# Patient Record
Sex: Female | Born: 1955 | Race: White | Hispanic: No | Marital: Single | Smoking: Former smoker
Health system: Southern US, Community
[De-identification: ages and names within clinical notes are randomized; demographics above are authoritative.]

## PROBLEM LIST (undated history)

## (undated) DIAGNOSIS — F32A Depression, unspecified: Secondary | ICD-10-CM

## (undated) DIAGNOSIS — N6459 Other signs and symptoms in breast: Secondary | ICD-10-CM

## (undated) DIAGNOSIS — D1720 Benign lipomatous neoplasm of skin and subcutaneous tissue of unspecified limb: Secondary | ICD-10-CM

## (undated) DIAGNOSIS — Z86010 Personal history of colonic polyps: Secondary | ICD-10-CM

## (undated) DIAGNOSIS — J111 Influenza due to unidentified influenza virus with other respiratory manifestations: Secondary | ICD-10-CM

## (undated) DIAGNOSIS — M1990 Unspecified osteoarthritis, unspecified site: Secondary | ICD-10-CM

## (undated) DIAGNOSIS — F329 Major depressive disorder, single episode, unspecified: Secondary | ICD-10-CM

## (undated) DIAGNOSIS — E669 Obesity, unspecified: Secondary | ICD-10-CM

## (undated) HISTORY — DX: Unspecified osteoarthritis, unspecified site: M19.90

## (undated) HISTORY — DX: Major depressive disorder, single episode, unspecified: F32.9

## (undated) HISTORY — PX: COLONOSCOPY: SHX174

## (undated) HISTORY — PX: REDUCTION MAMMAPLASTY: SUR839

## (undated) HISTORY — PX: ABDOMINAL HYSTERECTOMY: SHX81

## (undated) HISTORY — DX: Obesity, unspecified: E66.9

## (undated) HISTORY — DX: Influenza due to unidentified influenza virus with other respiratory manifestations: J11.1

## (undated) HISTORY — DX: Benign lipomatous neoplasm of skin and subcutaneous tissue of unspecified limb: D17.20

## (undated) HISTORY — DX: Other signs and symptoms in breast: N64.59

## (undated) HISTORY — DX: Depression, unspecified: F32.A

## (undated) HISTORY — PX: BREAST EXCISIONAL BIOPSY: SUR124

## (undated) HISTORY — PX: CHOLECYSTECTOMY: SHX55

## (undated) HISTORY — DX: Personal history of colonic polyps: Z86.010

---
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
## 1980-01-10 HISTORY — PX: DILATION AND CURETTAGE OF UTERUS: SHX78

## 1982-01-09 HISTORY — PX: BREAST LUMPECTOMY: SHX2

## 1994-01-09 HISTORY — PX: BREAST REDUCTION SURGERY: SHX8

## 1997-01-09 HISTORY — PX: ANTERIOR CERVICAL DECOMP/DISCECTOMY FUSION: SHX1161

## 1998-01-09 HISTORY — PX: GASTRIC BYPASS: SHX52

## 2012-01-10 HISTORY — PX: WRIST FRACTURE SURGERY: SHX121

## 2014-01-21 ENCOUNTER — Other Ambulatory Visit: Payer: Self-pay | Admitting: Physician Assistant

## 2014-01-21 ENCOUNTER — Encounter: Payer: Self-pay | Admitting: Physician Assistant

## 2014-01-21 ENCOUNTER — Ambulatory Visit (INDEPENDENT_AMBULATORY_CARE_PROVIDER_SITE_OTHER): Payer: BC Managed Care – PPO

## 2014-01-21 ENCOUNTER — Ambulatory Visit (INDEPENDENT_AMBULATORY_CARE_PROVIDER_SITE_OTHER): Payer: BC Managed Care – PPO | Admitting: Physician Assistant

## 2014-01-21 VITALS — BP 112/68 | HR 70 | Ht 64.0 in | Wt 184.0 lb

## 2014-01-21 DIAGNOSIS — M25531 Pain in right wrist: Secondary | ICD-10-CM | POA: Diagnosis not present

## 2014-01-21 DIAGNOSIS — M25551 Pain in right hip: Secondary | ICD-10-CM

## 2014-01-21 DIAGNOSIS — Z9884 Bariatric surgery status: Secondary | ICD-10-CM

## 2014-01-21 DIAGNOSIS — B029 Zoster without complications: Secondary | ICD-10-CM | POA: Insufficient documentation

## 2014-01-21 DIAGNOSIS — M79604 Pain in right leg: Secondary | ICD-10-CM

## 2014-01-21 DIAGNOSIS — M25351 Other instability, right hip: Secondary | ICD-10-CM

## 2014-01-21 DIAGNOSIS — M25552 Pain in left hip: Secondary | ICD-10-CM

## 2014-01-21 DIAGNOSIS — M25562 Pain in left knee: Secondary | ICD-10-CM

## 2014-01-21 DIAGNOSIS — M25561 Pain in right knee: Secondary | ICD-10-CM

## 2014-01-21 DIAGNOSIS — Z8639 Personal history of other endocrine, nutritional and metabolic disease: Secondary | ICD-10-CM | POA: Diagnosis not present

## 2014-01-21 DIAGNOSIS — R2231 Localized swelling, mass and lump, right upper limb: Secondary | ICD-10-CM

## 2014-01-21 DIAGNOSIS — M654 Radial styloid tenosynovitis [de Quervain]: Secondary | ICD-10-CM | POA: Insufficient documentation

## 2014-01-21 MED ORDER — DICLOFENAC SODIUM 1.5 % TD SOLN: TRANSDERMAL | Status: DC

## 2014-01-21 NOTE — Progress Notes (Signed)
Subjective:    Patient ID: Julia Larson, female    DOB: [birth year], 57 y.o.   MRN: [id number]  HPI   Patient is a 59 year old female who presents to the clinic to reestablish care. She recently moved here from [facility] and needs a PCP.  .. Active Ambulatory Problems    Diagnosis Date Noted  . Shingles outbreak 01/21/2014  . H/O gastric bypass 01/21/2014  . History of diabetes mellitus 01/21/2014  . De Quervain's tenosynovitis, right 01/21/2014  . Mass of skin of right shoulder 01/21/2014  . Right wrist pain 01/26/2014  . Right leg pain 01/26/2014  . Unstable hip 01/26/2014   Resolved Ambulatory Problems    Diagnosis Date Noted  . No Resolved Ambulatory Problems   No Additional Past Medical History   .Marland Kitchen Family History  Problem Relation Age of Onset  . Cancer Mother   . Cancer Father   . Alcohol abuse Sister   . Alcohol abuse Brother    .Marland Kitchen History   Social History  . Marital Status: Unknown    Spouse Name: N/A    Number of Children: N/A  . Years of Education: N/A   Occupational History  . Not on file.   Social History Main Topics  . Smoking status: Former Research scientist (life sciences)  . Smokeless tobacco: Not on file  . Alcohol Use: 0.0 oz/week    0 Not specified per week  . Drug Use: No  . Sexual Activity: Not Currently   Other Topics Concern  . Not on file   Social History Narrative  . No narrative on file    Patient is currently only on a multivitamin and B12. She currently has a few ongoing issues she would like to discuss.  She has a mass on her right shoulder that has been present for a few months. Sometimes it is painful when she lifts her shoulder above her head. She's not noticed it getting any bigger. She denies any known trauma. She's not tried anything to make better.  She is also having some right wrist pain that radiates of her right thumb into her forearm. She is seen within the past for this and they have given her an injection. Seems to help for a while and  then gets worse again. She does not have a brace for this. Pain is becoming more persistent lately. She does not like to take oral NSAIDs do to history of gastric bypass.  She is also having one month of right anterior leg pain. She also feels like her right hip is unstable. She denies any back pain and denies any injury. The pain is sometimes sharp and all of a sudden then turns into a dull throb. Laying down seems to make worse.     Review of Systems  All other systems reviewed and are negative.      Objective:   Physical Exam  Constitutional: She appears well-developed and well-nourished.  HENT:  Head: Normocephalic and atraumatic.  Cardiovascular: Normal rate, regular rhythm and normal heart sounds.   Pulmonary/Chest: Effort normal and breath sounds normal.  Musculoskeletal:  Right shoulder just posterior to acromion. Mobile nodule. Non-tender no erythema.  Full ROM of right shoulder.  Some discomfort with abduction to 180 degrees.   Right knee and right hip full ROM.  No joint tenderness over right knee.  No laxity noted.  No pain with palpation over anterior right thigh.   Right wrist ROM normal.  + flinklestein right side only.  Tenderness with  palpation over extensor tendon of thumb.   Skin: Skin is dry.  Psychiatric: She has a normal mood and affect. Her behavior is normal.          Assessment & Plan:  Right shoulder mass- appears to be cystic with presentation of physical exam. I would like for her to see Dr. Darene Lamer and consider u/s guided aspiration and injection for this. Pt not in tremendous amt of pain she is more than welcome to use pennsaid over area for pain. Follow up as needed.    Right de quervain's syndrome/right wrist pain- she has records from [facility] [place] where they suggested injection. Dr. Darene Lamer can also evaluate need for injection. Place in thumb spica splint today. Rest ice and use topical NSAID.  Hx of gastric bypass- doing well. Has kept some  weight off but still obese. On B12. Wants to stay away from oral NSAIds.   Hx of DM- discussed need for labs for follow up.   Right leg pain/unstable hip- will get xray of hip and knees today. Certainly use NSAID topical. Dr. Darene Lamer can evaluate as well at his visit. Did not have time in this visit for full evaluation.    Discuss with patient she needs a complete physical in the next 4-6 weeks. Come in fasting.

## 2014-01-26 DIAGNOSIS — M25531 Pain in right wrist: Secondary | ICD-10-CM | POA: Insufficient documentation

## 2014-01-26 DIAGNOSIS — M79604 Pain in right leg: Secondary | ICD-10-CM | POA: Insufficient documentation

## 2014-01-26 DIAGNOSIS — M1611 Unilateral primary osteoarthritis, right hip: Secondary | ICD-10-CM | POA: Insufficient documentation

## 2014-01-28 ENCOUNTER — Telehealth: Payer: Self-pay

## 2014-01-28 NOTE — Telephone Encounter (Signed)
PA required for Diclofenac 1.5 % topical. Fax received today. Will route to [facility].

## 2014-01-29 NOTE — Telephone Encounter (Signed)
Prior auth initiated. Waiting for decision.

## 2014-01-29 NOTE — Telephone Encounter (Signed)
Diclofenac approval. Pharmacy notified and patient.

## 2014-02-02 ENCOUNTER — Telehealth: Payer: Self-pay | Admitting: Emergency Medicine

## 2014-02-02 NOTE — Telephone Encounter (Signed)
PA for Diclofenac has been approved.

## 2014-02-17 ENCOUNTER — Encounter: Payer: Self-pay | Admitting: Sports Medicine

## 2014-02-17 ENCOUNTER — Ambulatory Visit (INDEPENDENT_AMBULATORY_CARE_PROVIDER_SITE_OTHER): Payer: BC Managed Care – PPO | Admitting: Physician Assistant

## 2014-02-17 ENCOUNTER — Encounter: Payer: Self-pay | Admitting: Physician Assistant

## 2014-02-17 ENCOUNTER — Ambulatory Visit (INDEPENDENT_AMBULATORY_CARE_PROVIDER_SITE_OTHER): Payer: BC Managed Care – PPO | Admitting: Sports Medicine

## 2014-02-17 VITALS — BP 131/68 | HR 74 | Ht 64.0 in | Wt 182.0 lb

## 2014-02-17 DIAGNOSIS — M174 Other bilateral secondary osteoarthritis of knee: Secondary | ICD-10-CM | POA: Diagnosis not present

## 2014-02-17 DIAGNOSIS — L919 Hypertrophic disorder of the skin, unspecified: Secondary | ICD-10-CM

## 2014-02-17 DIAGNOSIS — D1720 Benign lipomatous neoplasm of skin and subcutaneous tissue of unspecified limb: Secondary | ICD-10-CM | POA: Diagnosis not present

## 2014-02-17 DIAGNOSIS — Z8639 Personal history of other endocrine, nutritional and metabolic disease: Secondary | ICD-10-CM | POA: Diagnosis not present

## 2014-02-17 DIAGNOSIS — Z9884 Bariatric surgery status: Secondary | ICD-10-CM

## 2014-02-17 DIAGNOSIS — M170 Bilateral primary osteoarthritis of knee: Secondary | ICD-10-CM | POA: Diagnosis not present

## 2014-02-17 DIAGNOSIS — L819 Disorder of pigmentation, unspecified: Secondary | ICD-10-CM

## 2014-02-17 DIAGNOSIS — L987 Excessive and redundant skin and subcutaneous tissue: Secondary | ICD-10-CM

## 2014-02-17 DIAGNOSIS — M25531 Pain in right wrist: Secondary | ICD-10-CM | POA: Diagnosis not present

## 2014-02-17 DIAGNOSIS — Z0000 Encounter for general adult medical examination without abnormal findings: Secondary | ICD-10-CM

## 2014-02-17 DIAGNOSIS — Z13220 Encounter for screening for lipoid disorders: Secondary | ICD-10-CM

## 2014-02-17 DIAGNOSIS — Z1239 Encounter for other screening for malignant neoplasm of breast: Secondary | ICD-10-CM

## 2014-02-17 DIAGNOSIS — L814 Other melanin hyperpigmentation: Secondary | ICD-10-CM

## 2014-02-17 DIAGNOSIS — M79604 Pain in right leg: Secondary | ICD-10-CM

## 2014-02-17 LAB — COMPLETE METABOLIC PANEL WITH GFR
ALT: 17 U/L (ref 0–35)
AST: 21 U/L (ref 0–37)
Albumin: 4 g/dL (ref 3.5–5.2)
Alkaline Phosphatase: 85 U/L (ref 39–117)
BUN: 15 mg/dL (ref 6–23)
CALCIUM: 9.1 mg/dL (ref 8.4–10.5)
CHLORIDE: 106 meq/L (ref 96–112)
CO2: 27 mEq/L (ref 19–32)
CREATININE: 0.82 mg/dL (ref 0.50–1.10)
GFR, Est African American: >89 mL/min
GFR, Est Non African American: 80 mL/min
Glucose, Bld: 97 mg/dL (ref 70–99)
Potassium: 5 mEq/L (ref 3.5–5.3)
SODIUM: 144 meq/L (ref 135–145)
Total Bilirubin: 0.4 mg/dL (ref 0.2–1.2)
Total Protein: 6 g/dL (ref 6.0–8.3)

## 2014-02-17 LAB — HEMOGLOBIN A1C
Hgb A1c MFr Bld: 5.8 % — ABNORMAL HIGH (ref <5.7)
Mean Plasma Glucose: 120 mg/dL — ABNORMAL HIGH (ref <117)

## 2014-02-17 LAB — LIPID PANEL
CHOL/HDL RATIO: 2.2 ratio
Cholesterol: 174 mg/dL (ref 0–200)
HDL: 80 mg/dL (ref >39)
LDL CALC: 77 mg/dL (ref 0–99)
Triglycerides: 87 mg/dL (ref <150)
VLDL: 17 mg/dL (ref 0–40)

## 2014-02-17 MED ORDER — ACETAMINOPHEN ER 650 MG PO TBCR: 1300.0000 mg | EXTENDED_RELEASE_TABLET | Freq: Three times a day (TID) | ORAL | Status: DC | PRN

## 2014-02-17 MED ORDER — DICLOFENAC SODIUM 1.5 % TD SOLN: TRANSDERMAL | Status: DC

## 2014-02-17 NOTE — Assessment & Plan Note (Signed)
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  Intralesional injection performed in the hopes of causing some atrophy. Certainly surgical excision could be a possibility in the future however patient is not excited about a scar on her shoulder.

## 2014-02-17 NOTE — Patient Instructions (Addendum)
Cetaphil, eucerin, auqafor.   Mammogram- due march.  Hydroquinone products salacylic acid products.     Keeping You Healthy  Get These Tests  Blood Pressure- Have your blood pressure checked by your healthcare provider at least once a year.  Normal blood pressure is 120/80.  Weight- Have your body mass index (BMI) calculated to screen for obesity.  BMI is a measure of body fat based on height and weight.  You can calculate your own BMI at [employer].it  Cholesterol- Have your cholesterol checked every year.  Diabetes- Have your blood sugar checked every year if you have high blood pressure, high cholesterol, a family history of diabetes or if you are overweight.  Pap Smear- Have a pap smear every 1 to 3 years if you have been sexually active.  If you are older than 65 and recent pap smears have been normal you may not need additional pap smears.  In addition, if you have had a hysterectomy  For benign disease additional pap smears are not necessary.  Mammogram-Yearly mammograms are essential for early detection of breast cancer  Screening for Colon Cancer- Colonoscopy starting at age 6. Screening may begin sooner depending on your family history and other health conditions.  Follow up colonoscopy as directed by your Gastroenterologist.  Screening for Osteoporosis- Screening begins at age 66 with bone density scanning, sooner if you are at higher risk for developing Osteoporosis.  Get these medicines  Calcium with Vitamin D- Your body requires 1200-1500 mg of Calcium a day and [phone] IU of Vitamin D a day.  You can only absorb 500 mg of Calcium at a time therefore Calcium must be taken in 2 or 3 separate doses throughout the day.  Hormones- Hormone therapy has been associated with increased risk for certain cancers and heart disease.  Talk to your healthcare provider about if you need relief from menopausal symptoms.  Aspirin- Ask your healthcare provider about taking  Aspirin to prevent Heart Disease and Stroke.  Get these Immuniztions  Flu shot- Every fall  Pneumonia shot- Once after the age of 25; if you are younger ask your healthcare provider if you need a pneumonia shot.  Tetanus- Every ten years.  Zostavax- Once after the age of 36 to prevent shingles.  Take these steps  Don't smoke- Your healthcare provider can help you quit. For tips on how to quit, ask your healthcare provider or go to www.smokefree.gov or call 1-800 QUIT-NOW.  Be physically active- Exercise 5 days a week for a minimum of 30 minutes.  If you are not already physically active, start slow and gradually work up to 30 minutes of moderate physical activity.  Try walking, dancing, bike riding, swimming, etc.  Eat a healthy diet- Eat a variety of healthy foods such as fruits, vegetables, whole grains, low fat milk, low fat cheeses, yogurt, lean meats, chicken, fish, eggs, dried beans, tofu, etc.  For more information go to www.thenutritionsource.org  Dental visit- Brush and floss teeth twice daily; visit your dentist twice a year.  Eye exam- Visit your Optometrist or Ophthalmologist yearly.  Drink alcohol in moderation- Limit alcohol intake to one drink or less a day.  Never drink and drive.  Depression- Your emotional health is as important as your physical health.  If you're feeling down or losing interest in things you normally enjoy, please talk to your healthcare provider.  Seat Belts- can save your life; always wear one  Smoke/Carbon Monoxide detectors- These detectors need to be installed on  the appropriate level of your home.  Replace batteries at least once a year.  Violence- If anyone is threatening or hurting you, please tell your healthcare provider.  Living Will/ Health care power of attorney- Discuss with your healthcare provider and family.

## 2014-02-17 NOTE — Assessment & Plan Note (Signed)
Pain does go into the first and second fingers of the right hand, it sounds as though she had a de Quervain's injection without ultrasound guidance that did not provide any improvement. I do think her symptoms are likely related to a right cervical radiculitis in a C6 distribution. We will discuss this further at a future visit.

## 2014-02-17 NOTE — Progress Notes (Signed)
   Subjective:    I'm seeing this patient as a consultation for:  Iran Planas, PA-C  CC: multiple complaints  HPI: This is a pleasant 59 year old female, she comes in with a mass on the top of her right shoulder. She is post-gastric bypass surgery, and has had a significant weight loss, the mass is staying approximately the same, not growing, only minimally tender without any drainage.  Bilateral knee pain: History of osteoarthritis on x-rays, pain is moderate, persistent, she does have a trip coming up to [place] in 2 weeks. She has not yet started her topical anti-inflammatories or Tylenol. Unable to take oral NSAIDs due to gastric bypass.  Right wrist pain: Pain goes into the first and second fingers of the right hand, denies any neck pain, she did have what sounds to be de Quervain's injection by orthopedic surgeon that did not provide any improvement. She is post ORIF of a right wrist fracture.  Past medical history, Surgical history, Family history not pertinant except as noted below, Social history, Allergies, and medications have been entered into the medical record, reviewed, and no changes needed.   Review of Systems: No headache, visual changes, nausea, vomiting, diarrhea, constipation, dizziness, abdominal pain, skin rash, fevers, chills, night sweats, weight loss, swollen lymph nodes, body aches, joint swelling, muscle aches, chest pain, shortness of breath, mood changes, visual or auditory hallucinations.   Objective:   General: Well Developed, well nourished, and in no acute distress.  Neuro/Psych: Alert and oriented x3, extra-ocular muscles intact, able to move all 4 extremities, sensation grossly intact. Skin: Warm and dry, no rashes noted.  Respiratory: Not using accessory muscles, speaking in full sentences, trachea midline.  Cardiovascular: Pulses palpable, no extremity edema. Abdomen: Does not appear distended. Right shoulder: There is a palpable, well-defined, movable  mass, approximately 3 cm across. Minimally tender to palpation. Bilateral Knee: Significant fat/pannus over both knees, also tender to palpation at the medial joint line bilaterally. ROM normal in flexion and extension and lower leg rotation. Ligaments with solid consistent endpoints including ACL, PCL, LCL, MCL. Negative Mcmurray's and provocative meniscal tests. Non painful patellar compression. Patellar and quadriceps tendons unremarkable. Hamstring and quadriceps strength is normal.  Procedure: Real-time Ultrasound Guided intralesional steroid injection of right upper shoulder lipoma Device: GE Logiq E  Verbal informed consent obtained.  Time-out conducted.  Noted no overlying erythema, induration, or other signs of local infection.  Skin prepped in a sterile fashion.  Local anesthesia: Topical Ethyl chloride.  With sterile technique and under real time ultrasound guidance:  Noted well-defined mass, isoechoic with the surrounding fat tissue. 25-gauge needle was advanced and 1 mL kenalog 40, 2 mL lidocaine injected easily. Completed without difficulty  Pain immediately resolved suggesting accurate placement of the medication.  Advised to call if fevers/chills, erythema, induration, drainage, or persistent bleeding.  Images permanently stored and available for review in the ultrasound unit.  Impression: Technically successful ultrasound guided injection.  Impression and Recommendations:   This case required medical decision making of moderate complexity.

## 2014-02-17 NOTE — Assessment & Plan Note (Signed)
Ultrasound did not show any effusion. Fullness over the knees is simply pannus from her weight loss. She will need plastic surgery for excision rather than general surgery. She is going to do Tylenol as well as topical NSAIDs further week, if she still has pain she can return by late next week and I will do bilateral knee injections in anticipation of her trip to [place] in 2 weeks.

## 2014-02-18 ENCOUNTER — Telehealth: Payer: Self-pay | Admitting: *Deleted

## 2014-02-18 ENCOUNTER — Other Ambulatory Visit: Payer: Self-pay | Admitting: Physician Assistant

## 2014-02-18 LAB — VITAMIN D 25 HYDROXY (VIT D DEFICIENCY, FRACTURES): Vit D, 25-Hydroxy: 20 ng/mL — ABNORMAL LOW (ref 30–100)

## 2014-02-18 LAB — TSH: TSH: 1.195 u[IU]/mL (ref 0.350–4.500)

## 2014-02-18 LAB — VITAMIN B12: VITAMIN B 12: 402 pg/mL (ref 211–911)

## 2014-02-18 MED ORDER — VITAMIN D (ERGOCALCIFEROL) 1.25 MG (50000 UNIT) PO CAPS: 50000.0000 [IU] | ORAL_CAPSULE | ORAL | Status: DC

## 2014-02-18 NOTE — Progress Notes (Signed)
  Subjective:     Julia Larson is a 59 y.o. female and is here for a comprehensive physical exam. The patient reports problems - see assessment and plan. Marland Kitchen  History   Social History  . Marital Status: Unknown    Spouse Name: N/A  . Number of Children: N/A  . Years of Education: N/A   Occupational History  . Not on file.   Social History Main Topics  . Smoking status: Former Research scientist (life sciences)  . Smokeless tobacco: Not on file  . Alcohol Use: 0.0 oz/week    0 Standard drinks or equivalent per week  . Drug Use: No  . Sexual Activity: Not Currently   Other Topics Concern  . Not on file   Social History Narrative   Health Maintenance  Topic Date Due  . INFLUENZA VACCINE  08/10/2014  . MAMMOGRAM  03/21/2015  . PAP SMEAR  02/10/2016  . TETANUS/TDAP  07/04/2016  . COLONOSCOPY  01/09/2018    The following portions of the patient's history were reviewed and updated as appropriate: allergies, current medications, past family history, past medical history, past social history, past surgical history and problem list.  Review of Systems A comprehensive review of systems was negative.   Objective:    BP 131/68 mmHg  Pulse 74  Ht 5\' 4"  (1.626 m)  Wt 182 lb (82.555 kg)  BMI 31.22 kg/m2 General appearance: alert, cooperative and moderately obese Head: Normocephalic, without obvious abnormality, atraumatic Eyes: conjunctivae/corneas clear. PERRL, EOM's intact. Fundi benign. Ears: normal TM's and external ear canals both ears Nose: Nares normal. Septum midline. Mucosa normal. No drainage or sinus tenderness. Throat: lips, mucosa, and tongue normal; teeth and gums normal Neck: no adenopathy, no carotid bruit, no JVD, supple, symmetrical, trachea midline and thyroid not enlarged, symmetric, no tenderness/mass/nodules Back: symmetric, no curvature. ROM normal. No CVA tenderness. Lungs: clear to auscultation bilaterally Heart: regular rate and rhythm, S1, S2 normal, no murmur, click, rub or  gallop Abdomen: soft, non-tender; bowel sounds normal; no masses,  no organomegaly Extremities: extremities normal, atraumatic, no cyanosis or edemaexcess skin and fatty deposits of both legs that fold over knees.  Pulses: 2+ and symmetric Skin: Skin color, texture, turgor normal. No rashes or lesions or age spots(brown macules) on face and arms. a few scaly erythematous macules around nose.  Lymph nodes: Cervical, supraclavicular, and axillary nodes normal. Neurologic: Grossly normal    Assessment:    Healthy female exam.      Plan:     CPE- ordered fasting labs. Vaccines up to date. Colonoscopy done. Needs mammogram in march, ordered. Discussed vitamin D and calcium.   Hx of gastric bypass/hx of diabetes- ordered A!C. Encouraged patient to continue walking and diabetic diet.   Osteoarthritis of bilateral knees/right leg pain- pennsaid sample and sent prescription to mail order pharmacy. Due to gastric bypass needs to stay away from oral NSAIDS. Dr. Darene Lamer for further management and injections. Will get b12 and vitamin due to absorption issues.   Right leg pain- possible due to osteoarthritis and extra skin from losing weight. Will refer to plastic surgeon for skin removal. Possible that pennsaid could also help.   Age spots- discussed therapies OTC that have hydroquinone and salacylic acid. Sunscreen to avoid more.   Actinic keratosis- offered to freeze. Going on a trip will schedule an appt when she returns.  See After Visit Summary for Counseling Recommendations

## 2014-02-24 ENCOUNTER — Encounter: Payer: Self-pay | Admitting: Sports Medicine

## 2014-02-24 ENCOUNTER — Ambulatory Visit (INDEPENDENT_AMBULATORY_CARE_PROVIDER_SITE_OTHER): Payer: BC Managed Care – PPO | Admitting: Sports Medicine

## 2014-02-24 VITALS — BP 102/61 | HR 87 | Ht 64.0 in | Wt 182.0 lb

## 2014-02-24 DIAGNOSIS — D1720 Benign lipomatous neoplasm of skin and subcutaneous tissue of unspecified limb: Secondary | ICD-10-CM | POA: Diagnosis not present

## 2014-02-24 DIAGNOSIS — M170 Bilateral primary osteoarthritis of knee: Secondary | ICD-10-CM

## 2014-02-24 MED ORDER — MAGNESIUM OXIDE 400 MG PO TABS: 800.0000 mg | ORAL_TABLET | Freq: Every day | ORAL | Status: DC

## 2014-02-24 NOTE — Progress Notes (Signed)
  Subjective:    CC: Follow-up  HPI: Cervical radiculopathy: We will discuss this in detail and further visit, stable.  Bilateral knee pain: Likely due to osteoarthritis, she does describe it as an aching sensation, moderate, persistent, worse after periods of activity. No radicular pain and no back pain. Topical diclofenac has not provided relief, oral Tylenol only provides minimal relief.  Right shoulder lipoma: Doing well, less painful and slightly smaller after an intralesional injection at the last visit.  Past medical history, Surgical history, Family history not pertinant except as noted below, Social history, Allergies, and medications have been entered into the medical record, reviewed, and no changes needed.   Review of Systems: No fevers, chills, night sweats, weight loss, chest pain, or shortness of breath.   Objective:    General: Well Developed, well nourished, and in no acute distress.  Neuro: Alert and oriented x3, extra-ocular muscles intact, sensation grossly intact.  HEENT: Normocephalic, atraumatic, pupils equal round reactive to light, neck supple, no masses, no lymphadenopathy, thyroid nonpalpable.  Skin: Warm and dry, no rashes. Cardiac: Regular rate and rhythm, no murmurs rubs or gallops, no lower extremity edema.  Respiratory: Clear to auscultation bilaterally. Not using accessory muscles, speaking in full sentences.  Procedure: Real-time Ultrasound Guided Injection of left knee Device: GE Logiq E  Verbal informed consent obtained.  Time-out conducted.  Noted no overlying erythema, induration, or other signs of local infection.  Skin prepped in a sterile fashion.  Local anesthesia: Topical Ethyl chloride.  With sterile technique and under real time ultrasound guidance:  1 mL Kenalog 40, 4 mL lidocaine injected easily. Completed without difficulty  Pain immediately resolved suggesting accurate placement of the medication.  Advised to call if fevers/chills,  erythema, induration, drainage, or persistent bleeding.  Images permanently stored and available for review in the ultrasound unit.  Impression: Technically successful ultrasound guided injection.  Procedure: Real-time Ultrasound Guided Injection of right knee Device: GE Logiq E  Verbal informed consent obtained.  Time-out conducted.  Noted no overlying erythema, induration, or other signs of local infection.  Skin prepped in a sterile fashion.  Local anesthesia: Topical Ethyl chloride.  With sterile technique and under real time ultrasound guidance:  1 mL Kenalog 40, 4 mL lidocaine injected easily. Completed without difficulty  Pain immediately resolved suggesting accurate placement of the medication.  Advised to call if fevers/chills, erythema, induration, drainage, or persistent bleeding.  Images permanently stored and available for review in the ultrasound unit.  Impression: Technically successful ultrasound guided injection.  Impression and Recommendations:

## 2014-02-24 NOTE — Assessment & Plan Note (Addendum)
No improvement with topical NSAIDs. Injection today, she does have a trip coming up to [place] next week. Return in one month. There has been some lower extremity cramping, this is likely related to the osteoarthritis however certainly lumbar radiculopathy as a possibility, I'm going to add some magnesium for use in the meantime.

## 2014-02-24 NOTE — Assessment & Plan Note (Signed)
Doing well after intralesional injection, we will likely need to do a few repeat.

## 2014-03-24 ENCOUNTER — Encounter: Payer: Self-pay | Admitting: Sports Medicine

## 2014-03-24 ENCOUNTER — Ambulatory Visit (INDEPENDENT_AMBULATORY_CARE_PROVIDER_SITE_OTHER): Payer: BC Managed Care – PPO | Admitting: Sports Medicine

## 2014-03-24 VITALS — BP 127/83 | HR 115 | Wt 180.0 lb

## 2014-03-24 DIAGNOSIS — D1720 Benign lipomatous neoplasm of skin and subcutaneous tissue of unspecified limb: Secondary | ICD-10-CM | POA: Diagnosis not present

## 2014-03-24 DIAGNOSIS — M170 Bilateral primary osteoarthritis of knee: Secondary | ICD-10-CM

## 2014-03-24 NOTE — Assessment & Plan Note (Addendum)
Doing extremely well after injections, she had a good trip to [place]. Cramping is also resolved with occasional magnesium oxide. She can return for viscous supplementation of pain returns within 3 months of the injection.

## 2014-03-24 NOTE — Progress Notes (Signed)
  Subjective:    CC: Follow-up  HPI: Bilateral knee osteoarthritis: Julia Larson returns, we injected her knees last month, she had a fantastic response and was able to have a good trip to [place]. Does not need any further treatment.  Right shoulder lipoma: It is somewhat smaller after intralesional injection a month ago, still somewhat tender, at this point she does not desire any further treatment, feels okay.  Past medical history, Surgical history, Family history not pertinant except as noted below, Social history, Allergies, and medications have been entered into the medical record, reviewed, and no changes needed.   Review of Systems: No fevers, chills, night sweats, weight loss, chest pain, or shortness of breath.   Objective:    General: Well Developed, well nourished, and in no acute distress.  Neuro: Alert and oriented x3, extra-ocular muscles intact, sensation grossly intact.  HEENT: Normocephalic, atraumatic, pupils equal round reactive to light, neck supple, no masses, no lymphadenopathy, thyroid nonpalpable.  Skin: Warm and dry, no rashes. The lipoma over her right shoulder feels smaller, is softer, and less tender. Cardiac: Regular rate and rhythm, no murmurs rubs or gallops, no lower extremity edema.  Respiratory: Clear to auscultation bilaterally. Not using accessory muscles, speaking in full sentences.  Impression and Recommendations:

## 2014-03-24 NOTE — Assessment & Plan Note (Signed)
Improved significantly with intralesional injection. She can return for additional intralesional injections as needed.

## 2014-04-02 ENCOUNTER — Ambulatory Visit (INDEPENDENT_AMBULATORY_CARE_PROVIDER_SITE_OTHER): Payer: BC Managed Care – PPO

## 2014-04-02 DIAGNOSIS — Z1231 Encounter for screening mammogram for malignant neoplasm of breast: Secondary | ICD-10-CM

## 2014-04-10 ENCOUNTER — Telehealth: Payer: Self-pay | Admitting: *Deleted

## 2014-04-10 ENCOUNTER — Telehealth: Payer: Self-pay | Admitting: Physician Assistant

## 2014-04-10 DIAGNOSIS — L987 Excessive and redundant skin and subcutaneous tissue: Secondary | ICD-10-CM

## 2014-04-10 NOTE — Telephone Encounter (Signed)
Pt would like an another referral to another Psychiatric nurse. Someone at the previous plastic surgeon office told her that her consultation would be free.

## 2014-04-10 NOTE — Telephone Encounter (Signed)
Julia Larson, Pt does not want this referral sent until it is clear that this is a medical issue. She states she has been seeing Dr T for her knees and she also mentioned about having two fatty tumors on both knees; this is why she thinks it is  medical issue and not cosmetic which was told her by Plastic Surgeon we referred her to previously. Thank you.

## 2014-04-10 NOTE — Telephone Encounter (Signed)
Call pt: discussed with Dr. Darene Lamer as well. No medical reason except if causing knee pain. Per Dr. Darene Lamer discuss with plastic surgeon and see if could document it as causing pain and see if covered.

## 2014-04-14 NOTE — Telephone Encounter (Signed)
I spoke with Rose at [facility].  The Dr is going to code visit as medical and not cosmetic.  I contacted patient  and got her vm just to make sure she wants [place] to send referral because she also had a problem with making copay.

## 2014-04-14 NOTE — Telephone Encounter (Signed)
Thank you :)

## 2014-04-30 ENCOUNTER — Ambulatory Visit (INDEPENDENT_AMBULATORY_CARE_PROVIDER_SITE_OTHER): Payer: BC Managed Care – PPO | Admitting: Sports Medicine

## 2014-04-30 ENCOUNTER — Ambulatory Visit (INDEPENDENT_AMBULATORY_CARE_PROVIDER_SITE_OTHER): Payer: BC Managed Care – PPO

## 2014-04-30 ENCOUNTER — Encounter: Payer: Self-pay | Admitting: Sports Medicine

## 2014-04-30 VITALS — BP 108/63 | HR 60 | Ht 64.0 in | Wt 180.0 lb

## 2014-04-30 DIAGNOSIS — M25351 Other instability, right hip: Secondary | ICD-10-CM

## 2014-04-30 DIAGNOSIS — M5136 Other intervertebral disc degeneration, lumbar region: Secondary | ICD-10-CM

## 2014-04-30 NOTE — Progress Notes (Signed)
  Subjective:    CC: Right hip pain  HPI: This pleasant 59 year old female returns, she has a history of knee osteoarthritis that has responded well to injections, unfortunately she's also had pain that she localizes deep in the right groin, moderate, persistent with occasional radiation to the anterior aspect of the right leg but not past the knee. It occasionally buckles, pain is very ill localized, she denies any back pain.  Past medical history, Surgical history, Family history not pertinant except as noted below, Social history, Allergies, and medications have been entered into the medical record, reviewed, and no changes needed.   Review of Systems: No fevers, chills, night sweats, weight loss, chest pain, or shortness of breath.   Objective:    General: Well Developed, well nourished, and in no acute distress.  Neuro: Alert and oriented x3, extra-ocular muscles intact, sensation grossly intact.  HEENT: Normocephalic, atraumatic, pupils equal round reactive to light, neck supple, no masses, no lymphadenopathy, thyroid nonpalpable.  Skin: Warm and dry, no rashes. Cardiac: Regular rate and rhythm, no murmurs rubs or gallops, no lower extremity edema.  Respiratory: Clear to auscultation bilaterally. Not using accessory muscles, speaking in full sentences. Right Hip: ROM IR: 60 Deg, ER: 60 Deg, Flexion: 120 Deg, Extension: 100 Deg, Abduction: 45 Deg, Adduction: 45 Deg, unable to really reproduce pain with internal rotation except once, hip flexion is okay, Strength IR: 5/5, ER: 5/5, Flexion: 5/5, Extension: 5/5, Abduction: 5/5, Adduction: 5/5 Pelvic alignment unremarkable to inspection and palpation. Standing hip rotation and gait without trendelenburg / unsteadiness. Greater trochanter without tenderness to palpation. No tenderness over piriformis. No SI joint tenderness and normal minimal SI movement.  X-rays do show mild bilateral hip osteoarthritis  Procedure: Real-time  Ultrasound Guided Injection of right femoroacetabular joint Device: GE Logiq E  Verbal informed consent obtained.  Time-out conducted.  Noted no overlying erythema, induration, or other signs of local infection.  Skin prepped in a sterile fashion.  Local anesthesia: Topical Ethyl chloride.  With sterile technique and under real time ultrasound guidance:   Spinal needle advanced into the femoral head/neck junction, 1 mL kenalog 40, 4 mL lidocaine injected easily. Completed without difficulty  There was moderate relief of the pain after the injection Advised to call if fevers/chills, erythema, induration, drainage, or persistent bleeding.  Images permanently stored and available for review in the ultrasound unit.  Impression: Technically successful ultrasound guided injection.  Impression and Recommendations:

## 2014-04-30 NOTE — Assessment & Plan Note (Addendum)
X-rays do show osteoarthritis of the right hip. Physical exam is fairly benign. Her pain is referred to the groin, with radiation to the anterior thigh, she has no back pain, and none of the pain sounds radicular. We are very limited in our ability to use NSAIDs. Hip injection as above, return in one month. Hip flexor rehabilitation given.  Also having a sensation of toes cramping on the right side, x-ray of the lumbar spine as well. If no improvement we will do formal physical therapy.

## 2014-05-28 ENCOUNTER — Ambulatory Visit (INDEPENDENT_AMBULATORY_CARE_PROVIDER_SITE_OTHER): Payer: BC Managed Care – PPO | Admitting: Sports Medicine

## 2014-05-28 ENCOUNTER — Encounter: Payer: Self-pay | Admitting: Sports Medicine

## 2014-05-28 VITALS — BP 110/60 | HR 63 | Ht 64.0 in | Wt 181.0 lb

## 2014-05-28 DIAGNOSIS — M1611 Unilateral primary osteoarthritis, right hip: Secondary | ICD-10-CM

## 2014-05-28 DIAGNOSIS — R252 Cramp and spasm: Secondary | ICD-10-CM

## 2014-05-28 MED ORDER — GABAPENTIN 300 MG PO CAPS: ORAL_CAPSULE | ORAL | Status: DC

## 2014-05-28 NOTE — Assessment & Plan Note (Signed)
Improved after femoral acetabular joint injection. Unfortunately she has not been doing her rehabilitation exercises. She would like another month to become more compliant with rehabilitation exercises before considering formal therapy.

## 2014-05-28 NOTE — Progress Notes (Signed)
  Subjective:    CC: Follow-up  HPI: Right hip osteoarthritis: Improvement however still has some pain, as well as instability. She really did not do any of her rehabilitation exercises.  Toe and hand cramping: Occurs occasionally, particularly at night, magnesium oxide was not helpful, amenable to try another medication.  Past medical history, Surgical history, Family history not pertinant except as noted below, Social history, Allergies, and medications have been entered into the medical record, reviewed, and no changes needed.   Review of Systems: No fevers, chills, night sweats, weight loss, chest pain, or shortness of breath.   Objective:    General: Well Developed, well nourished, and in no acute distress.  Neuro: Alert and oriented x3, extra-ocular muscles intact, sensation grossly intact.  HEENT: Normocephalic, atraumatic, pupils equal round reactive to light, neck supple, no masses, no lymphadenopathy, thyroid nonpalpable.  Skin: Warm and dry, no rashes. Cardiac: Regular rate and rhythm, no murmurs rubs or gallops, no lower extremity edema.  Respiratory: Clear to auscultation bilaterally. Not using accessory muscles, speaking in full sentences.  Impression and Recommendations:

## 2014-05-28 NOTE — Patient Instructions (Signed)
One half cup of pickle juice at bedtime.

## 2014-05-28 NOTE — Assessment & Plan Note (Signed)
Insufficient response to magnesium. Electrolytes have been normal. We are going to add gabapentin. If insufficient response we can proceed to baclofen at bedtime, and if still insufficient response then we can proceed down the pathway of nerve conduction, left mammography, as well as muscle biopsy looking for myotonic dystrophy. I've also advised her to drink half cup pickle juice at night.

## 2014-06-25 ENCOUNTER — Ambulatory Visit (INDEPENDENT_AMBULATORY_CARE_PROVIDER_SITE_OTHER): Payer: BC Managed Care – PPO | Admitting: Sports Medicine

## 2014-06-25 ENCOUNTER — Encounter: Payer: Self-pay | Admitting: Sports Medicine

## 2014-06-25 VITALS — BP 108/62 | HR 63 | Ht 64.0 in | Wt 181.0 lb

## 2014-06-25 DIAGNOSIS — M1611 Unilateral primary osteoarthritis, right hip: Secondary | ICD-10-CM

## 2014-06-25 DIAGNOSIS — M170 Bilateral primary osteoarthritis of knee: Secondary | ICD-10-CM

## 2014-06-25 DIAGNOSIS — R252 Cramp and spasm: Secondary | ICD-10-CM

## 2014-06-25 MED ORDER — PREGABALIN 75 MG PO CAPS: 75.0000 mg | ORAL_CAPSULE | Freq: Two times a day (BID) | ORAL | Status: DC

## 2014-06-25 NOTE — Assessment & Plan Note (Signed)
mild OA of the hips and knees. Adding formal physical therapy.

## 2014-06-25 NOTE — Assessment & Plan Note (Addendum)
At this point no response to gabapentin which left her too groggy in the morning, magnesium at 800 mg at bedtime, or even vinegar containing solutions. I am going to switch to Lyrica to use as needed and at bedtime. I would also like her to talk to a neurologist, it is possible that there is some element of lumbar radiculopathy, she does endorse almost an L4 distribution on the right side, and she does have some degenerative changes in her upper lumbar spine on x-rays. I will await nerve conduction and EMG. 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 There is also most likely a minor element of myofascial pain and depression, patient was tearful in the exam room, she declines any treatment for this. Return to see me in one month.  Patient is too afraid of Lyrica to take it. We will discontinue this prescription.

## 2014-06-25 NOTE — Progress Notes (Signed)
  Subjective:    CC: Follow-up  HPI: Lawana again returns with vague symptoms of pain over the anterior right thigh, she localizes it in almost what appears to be an L4 distribution. She only has minimal back pain. She does have knee arthritis, and upper lumbar degenerative disc disease on x-rays. She also has some hip arthritis which resolved with a hip joint injection under ultrasound guidance previously. She also describes this as cramping at night, unfortunately magnesium has not been helpful. It has been overall unclear as to the cause of her radiating pain.  Gastric bypass surgery: Has lost a significant mount of weight, she does endorse significant pain over the pannus hanging over her knees. She was unable to get approval from a previous Psychiatric nurse and is looking for another one.  Past medical history, Surgical history, Family history not pertinant except as noted below, Social history, Allergies, and medications have been entered into the medical record, reviewed, and no changes needed.   Review of Systems: No fevers, chills, night sweats, weight loss, chest pain, or shortness of breath.   Objective:    General: Well Developed, well nourished, and in no acute distress. Tearful in exam room. Neuro: Alert and oriented x3, extra-ocular muscles intact, sensation grossly intact.  HEENT: Normocephalic, atraumatic, pupils equal round reactive to light, neck supple, no masses, no lymphadenopathy, thyroid nonpalpable.  Skin: Warm and dry, no rashes. Cardiac: Regular rate and rhythm, no murmurs rubs or gallops, no lower extremity edema.  Respiratory: Clear to auscultation bilaterally. Not using accessory muscles, speaking in full sentences.  Impression and Recommendations:

## 2014-06-25 NOTE — Assessment & Plan Note (Signed)
Mild OA of the hips and knees. Adding formal physical therapy.

## 2014-07-21 ENCOUNTER — Ambulatory Visit: Payer: Self-pay | Admitting: Neurology

## 2014-07-23 ENCOUNTER — Ambulatory Visit: Payer: BC Managed Care – PPO | Admitting: Sports Medicine

## 2014-08-17 ENCOUNTER — Encounter: Payer: Self-pay | Admitting: Family Medicine

## 2014-08-17 ENCOUNTER — Ambulatory Visit: Payer: Self-pay | Admitting: Neurology

## 2014-08-17 ENCOUNTER — Ambulatory Visit (INDEPENDENT_AMBULATORY_CARE_PROVIDER_SITE_OTHER): Payer: BC Managed Care – PPO | Admitting: Family Medicine

## 2014-08-17 VITALS — BP 125/84 | HR 65 | Temp 98.0°F | Ht 64.0 in | Wt 184.0 lb

## 2014-08-17 DIAGNOSIS — H6123 Impacted cerumen, bilateral: Secondary | ICD-10-CM | POA: Diagnosis not present

## 2014-08-17 DIAGNOSIS — J0100 Acute maxillary sinusitis, unspecified: Secondary | ICD-10-CM | POA: Diagnosis not present

## 2014-08-17 MED ORDER — AMOXICILLIN-POT CLAVULANATE 875-125 MG PO TABS: 1.0000 | ORAL_TABLET | Freq: Two times a day (BID) | ORAL | Status: DC

## 2014-08-17 NOTE — Patient Instructions (Signed)
Can use Debrox weekly to soften ear wax

## 2014-08-17 NOTE — Progress Notes (Signed)
   Subjective:    Patient ID: Julia Larson, female    DOB: [birth year], 57 y.o.   MRN: [id number]  HPI Sinusitis sxs began Friday (4 days ago) and got worse. she has been coughing up green/yellow phlegm, but nasal drainage is clear. she was running a fever last night of 102. she has been taking tylenol cold and flu. Mild ST.  + night sweats.  + achy all over.  + HA.  Mild ear pain or pressure.  No diarrhea.  Using Tylenol cold and flu.  Bilateral cheeks are painful.   Review of Systems     Objective:   Physical Exam  Constitutional: She is oriented to person, place, and time. She appears well-developed and well-nourished.  HENT:  Head: Normocephalic and atraumatic.  Right Ear: External ear normal.  Left Ear: External ear normal.  Nose: Nose normal.  Mouth/Throat: Oropharynx is clear and moist.  TMs and canals are blocked by cerumen  Eyes: Conjunctivae and EOM are normal. Pupils are equal, round, and reactive to light.  Neck: Neck supple. No thyromegaly present.  Cardiovascular: Normal rate, regular rhythm and normal heart sounds.   Pulmonary/Chest: Effort normal and breath sounds normal. She has no wheezes.  Lymphadenopathy:    She has no cervical adenopathy.  Neurological: She is alert and oriented to person, place, and time.  Skin: Skin is warm and dry.  Psychiatric: She has a normal mood and affect.          Assessment & Plan:  Acute sinusitis - will tx with augmenting. May still be viral but with running such a high fever last night i am going to treat with antibiotic.  She is here with family for the week and will be going back to [facility] this weekend  Cerumen impaction - irrigation performed. Patient tolerated well.

## 2014-09-21 ENCOUNTER — Encounter: Payer: Self-pay | Admitting: Neurology

## 2014-09-21 ENCOUNTER — Ambulatory Visit (INDEPENDENT_AMBULATORY_CARE_PROVIDER_SITE_OTHER): Payer: BC Managed Care – PPO | Admitting: Neurology

## 2014-09-21 VITALS — BP 142/76 | HR 69 | Ht 64.0 in | Wt 183.0 lb

## 2014-09-21 DIAGNOSIS — E569 Vitamin deficiency, unspecified: Secondary | ICD-10-CM

## 2014-09-21 DIAGNOSIS — G2581 Restless legs syndrome: Secondary | ICD-10-CM | POA: Diagnosis not present

## 2014-09-21 DIAGNOSIS — M5416 Radiculopathy, lumbar region: Secondary | ICD-10-CM | POA: Diagnosis not present

## 2014-09-21 DIAGNOSIS — R29898 Other symptoms and signs involving the musculoskeletal system: Secondary | ICD-10-CM | POA: Diagnosis not present

## 2014-09-21 DIAGNOSIS — R5382 Chronic fatigue, unspecified: Secondary | ICD-10-CM | POA: Diagnosis not present

## 2014-09-21 DIAGNOSIS — R5383 Other fatigue: Secondary | ICD-10-CM | POA: Insufficient documentation

## 2014-09-21 DIAGNOSIS — W19XXXA Unspecified fall, initial encounter: Secondary | ICD-10-CM

## 2014-09-21 NOTE — Patient Instructions (Addendum)
Overall you are doing fairly well but I do want to suggest a few things today:   Remember to drink plenty of fluid, eat healthy meals and do not skip any meals. Try to eat protein with a every meal and eat a healthy snack such as fruit or nuts in between meals. Try to keep a regular sleep-wake schedule and try to exercise daily, particularly in the form of walking, 20-30 minutes a day, if you can.   As far as diagnostic testing: MRi of the lumbar spine, labs  Our phone number is [phone]. We also have an after hours call service for urgent matters and there is a physician on-call for urgent questions. For any emergencies you know to call 911 or go to the nearest emergency room

## 2014-09-21 NOTE — Progress Notes (Signed)
[facility]    Provider:  Dr Jaynee Eagles Referring Provider: Lavada Mesi Primary Care Physician:  Iran Planas, PA-C  CC:  She has right hip pain. Sharp jabs of pain  HPI:  Julia Larson is a 59 y.o. female here as a referral from Dr. Alden Hipp for Hip pain with radiation down the right leg. Symptoms started 3-4 months ago. Her hip give out. She has arthritis in the hips and in the knees. She has done injections into the joints and didn' t help. Symptoms are episodic and happen mostly at night after being on her feet all say. Pain feels crampy. Worse at night when she get home. Pain starts in the right groin and radiates down the front of her leg. Shooting pain is down her leg. She has had gastric bypass surgery and has lost a lot of weight. No inciting events, trauma or injury. Symptoms are worsening. She has weakness in the right leg associated with the pain. Worse when sleeping especially when she lays on the right. Better in the morning. Can we severe, at times 8/10 deep pain and she tries to rub it out, she has tried aspercreme which helps a little bit. She feels like she has to move her feet at night with a sensation on the bottom of her feet at night.   Reviewed notes, labs and imaging from outside physicians, which showed  FINDINGS: XR of the spine, personally reviewed imaging The lumbar vertebrae are in normal alignment. There is mild degenerative disc disease at L1-2 and L2-3 levels with some loss of disc space and sclerosis with spurring. No compression deformity is seen. On oblique views no significant degenerative change of the facet joints is seen. The SI joints are corticated. Surgical sutures and clips are noted in the left upper quadrant.  IMPRESSION: Normal alignment with mild degenerative disc disease at L1-2 and L2-3. No acute abnormality.  B12, tsh wnl  Review of Systems: Patient complains of symptoms per HPI as well as the following symptoms:  restless leg. Pertinent negatives per HPI. All others negative.   Social History   Social History  . Marital Status: Single    Spouse Name: N/A  . Number of Children: 2  . Years of Education: 12+   Occupational History  . [employer]    Social History Main Topics  . Smoking status: Former Smoker    Quit date: 11/09/1985  . Smokeless tobacco: Not on file  . Alcohol Use: 0.0 oz/week    0 Standard drinks or equivalent per week     Comment: 1-2 glasses per week (09/21/14)  . Drug Use: No  . Sexual Activity: Not Currently   Other Topics Concern  . Not on file   Social History Narrative   Lives at home with brother and sister-in-law   Caffeine use: 1-2 cups per day    Family History  Problem Relation Age of Onset  . Cancer Mother   . Cancer Father   . Alcohol abuse Sister   . Alcohol abuse Brother   . Hypertension    . Diabetes      History reviewed. No pertinent past medical history.  Past Surgical History  Procedure Laterality Date  . Abdominal hysterectomy    . Cholecystectomy    . Wrist fracture surgery Right 2014  . Breast reduction surgery Bilateral 1996  . Dilation and curettage of uterus  1982  . Tonsillectomy  1973  . Breast lumpectomy  1984  .  Cesarean section    . Anterior cervical decomp/discectomy fusion  1999  . Gastric bypass  2000    Current Outpatient Prescriptions  Medication Sig Dispense Refill  . acetaminophen (TYLENOL) 650 MG CR tablet Take 2 tablets (1,300 mg total) by mouth every 8 (eight) hours as needed for pain. 90 tablet 3  . Cyanocobalamin (B-12 SL) Place under the tongue.    . Multiple Vitamin (MULTIVITAMIN) tablet Take 1 tablet by mouth daily.     No current facility-administered medications for this visit.    Allergies as of 09/21/2014 - Review Complete 09/21/2014  Allergen Reaction Noted  . Adhesive [tape]  09/21/2014  . Morphine and related  03/24/2014  . Sulfa antibiotics Rash 01/21/2014    Vitals: BP  142/76 mmHg  Pulse 69  Ht 5\' 4"  (1.626 m)  Wt 183 lb (83.008 kg)  BMI 31.40 kg/m2 Last Weight:  Wt Readings from Last 1 Encounters:  09/21/14 183 lb (83.008 kg)   Last Height:   Ht Readings from Last 1 Encounters:  09/21/14 5\' 4"  (1.626 m)   Physical exam: Exam: Gen: NAD, conversant                     CV: RRR, no MRG. No Carotid Bruits. No peripheral edema Eyes: Conjunctivae clear without exudates or hemorrhage  Neuro: Detailed Neurologic Exam  Speech:    Speech is normal; fluent and spontaneous with normal comprehension.  Cognition:    The patient is oriented to person, place, and time;     recent and remote memory intact;     language fluent;     normal attention, concentration,     fund of knowledge Cranial Nerves:    The pupils are equal, round, and reactive to light. The fundi are flat. Visual fields are full to finger confrontation. Extraocular movements are intact. Trigeminal sensation is intact and the muscles of mastication are normal. The face is symmetric. The palate elevates in the midline. Hearing intact. Voice is normal. Shoulder shrug is normal. The tongue has normal motion without fasciculations.   Coordination:    Normal finger to nose and heel to shin. Normal rapid alternating movements.   Gait:    Heel-toe and tandem gait are normal.   Motor Observation:    No asymmetry, no atrophy, and no involuntary movements noted. Tone:    Normal muscle tone.    Posture:    Posture is normal. normal erect    Strength:    Strength is V/V in the upper and lower limbs.      Sensation: intact to LT     Reflex Exam:  DTR's:    Deep tendon reflexes in the upper and lower extremities are brisk bilaterally.   Toes:    The toes are downgoing bilaterally.   Clonus:    Clonus is absent.     Assessment/Plan:  59 year old female her with radicular symptoms, right leg pain, weakness in an L2/L3 distribution. XR did show degenerative changes at L2-L3.   MRI of  the lumbar spine for L2-L3 radiculopathy Physical therapy - patient prefers to wiat until after wkup EMG/NCS - patient prefers to proceed with noninvasive tests first Labs today, will check for nutritional deficiancies due to gastric bypass, feritin for RLS  Sarina Ill, MD  [facility] [address] [place], [place] [address]  Phone [phone] Fax [phone]

## 2014-09-23 ENCOUNTER — Telehealth: Payer: Self-pay

## 2014-09-23 LAB — CBC
HEMATOCRIT: 44.1 % (ref 34.0–46.6)
Hemoglobin: 14.1 g/dL (ref 11.1–15.9)
MCH: 26.7 pg (ref 26.6–33.0)
MCHC: 32 g/dL (ref 31.5–35.7)
MCV: 84 fL (ref 79–97)
Platelets: 162 10*3/uL (ref 150–379)
RBC: 5.28 x10E6/uL (ref 3.77–5.28)
RDW: 15.6 % — ABNORMAL HIGH (ref 12.3–15.4)
WBC: 4.3 10*3/uL (ref 3.4–10.8)

## 2014-09-23 LAB — COPPER, SERUM: Copper: 136 ug/dL (ref 72–166)

## 2014-09-23 LAB — FERRITIN: Ferritin: 13 ng/mL — ABNORMAL LOW (ref 15–150)

## 2014-09-23 LAB — FOLATE: Folate: 19.7 ng/mL (ref >3.0)

## 2014-09-23 LAB — ZINC: Zinc: 74 ug/dL (ref 56–134)

## 2014-09-23 LAB — VITAMIN B1: Thiamine: 174.2 nmol/L (ref 66.5–200.0)

## 2014-09-23 NOTE — Telephone Encounter (Signed)
I spoke with patient and went over her total $636.73 copay (I had discussed this with her on 09/22/14) I advised her that we could do up to 9 monthly payment plans of $70.75 and patient stated that she could not afford that at the moment since she is trying to purchase a home. She advised me that she will call and schedule MRI later after she closed on her home.

## 2014-09-23 NOTE — Telephone Encounter (Signed)
Advised pt that per Dr. Jaynee Eagles, her ferritin is low and this is associated with RLS. I advised her that Dr. Jaynee Eagles may think about doing OTC iron with OJ or vitamin C but since pt has had bariatric surgery, she recommends talking to her other doctor. Pt wishes me to fax these results to her PCP.  Pt wants me to alert Dr. Jaynee Eagles that she cannot afford her MRI at this time. She is wanting our office to call her to discuss payment plans, but even with payment plans, she may not be able to afford the MRI and wanted Dr. Jaynee Eagles to be aware.  I advised pt that I would let Dr. Jaynee Eagles know this, and I would give pt's information to our MRI scheduler to call and discuss payment plans. Pt verbalized understanding.

## 2014-09-23 NOTE — Telephone Encounter (Signed)
That's fine, thanks!

## 2014-09-23 NOTE — Telephone Encounter (Signed)
-----   Message from Melvenia Beam, MD sent at 09/23/2014  1:10 PM EDT ----- Let patient know her Ferritin level was low. This is a measure of iron stores. This is associated with restless leg syndrome. I may recommend iron supplementation from OTC 325mg  twice a day taken with vitamin C or OJ to help absorption. But since she has had bariatric surgery I recommend reaching out to her other doctor to see what they recommend for her low Ferritin and associated RLS (Feritin was 13 and normal range is 15-150)

## 2014-09-28 NOTE — Telephone Encounter (Signed)
I got records from neurologist. Right now start with ferrous sulfate 325mg  twice a day with meal and Vitamin C. Also recommend vitamin D 800 units daily. Will recheck labs in 2 months.

## 2014-09-29 NOTE — Telephone Encounter (Signed)
LMOM for pt to return call. 

## 2014-10-01 NOTE — Telephone Encounter (Signed)
Pt called back & was notified of recommendations.  She stated that with her having the gastric bypass, iron & vit d give her really bad diarrhea.

## 2014-10-02 NOTE — Telephone Encounter (Signed)
Would she like to try prescription fusion?

## 2014-10-06 NOTE — Telephone Encounter (Signed)
LMOM for pt to return call re: fusion rx.

## 2014-10-12 ENCOUNTER — Ambulatory Visit (INDEPENDENT_AMBULATORY_CARE_PROVIDER_SITE_OTHER): Payer: BC Managed Care – PPO | Admitting: Physician Assistant

## 2014-10-12 ENCOUNTER — Encounter: Payer: Self-pay | Admitting: Physician Assistant

## 2014-10-12 VITALS — BP 128/67 | HR 87 | Ht 64.0 in | Wt 181.0 lb

## 2014-10-12 DIAGNOSIS — L819 Disorder of pigmentation, unspecified: Secondary | ICD-10-CM

## 2014-10-12 DIAGNOSIS — L570 Actinic keratosis: Secondary | ICD-10-CM | POA: Diagnosis not present

## 2014-10-12 DIAGNOSIS — X32XXXA Exposure to sunlight, initial encounter: Principal | ICD-10-CM

## 2014-10-12 DIAGNOSIS — R790 Abnormal level of blood mineral: Secondary | ICD-10-CM

## 2014-10-12 DIAGNOSIS — L814 Other melanin hyperpigmentation: Secondary | ICD-10-CM

## 2014-10-12 DIAGNOSIS — R799 Abnormal finding of blood chemistry, unspecified: Secondary | ICD-10-CM | POA: Diagnosis not present

## 2014-10-12 MED ORDER — FUSION PLUS PO CAPS: ORAL_CAPSULE | ORAL | Status: DC

## 2014-10-16 DIAGNOSIS — X32XXXA Exposure to sunlight, initial encounter: Principal | ICD-10-CM

## 2014-10-16 DIAGNOSIS — R790 Abnormal level of blood mineral: Secondary | ICD-10-CM | POA: Insufficient documentation

## 2014-10-16 DIAGNOSIS — L814 Other melanin hyperpigmentation: Secondary | ICD-10-CM | POA: Insufficient documentation

## 2014-10-16 DIAGNOSIS — L570 Actinic keratosis: Secondary | ICD-10-CM | POA: Insufficient documentation

## 2014-10-16 MED ORDER — TRETINOIN 0.1 % EX CREA: TOPICAL_CREAM | Freq: Every day | CUTANEOUS | Status: DC

## 2014-10-16 NOTE — Progress Notes (Signed)
   Subjective:    Patient ID: Julia Larson, female    DOB: 1955/12/27, 59 y.o.   MRN: [id number]  HPI Pt would like AK removed and discuss age spots. She has used OTC treatments that have not worked.    Review of Systems  All other systems reviewed and are negative.      Objective:   Physical Exam  Constitutional: She appears well-developed and well-nourished.  HENT:  Head:            Assessment & Plan:  AK- Cryotherapy Procedure Note  Pre-operative Diagnosis: Actinic keratosis  Post-operative Diagnosis: Actinic keratosis  Locations: left nose  Indications: pre-cancer   Procedure Details  History of allergy to iodine: no. Pacemaker? no.  Patient informed of risks (permanent scarring, infection, light or dark discoloration, bleeding, infection, weakness, numbness and recurrence of the lesion) and benefits of the procedure and verbal informed consent obtained.  The areas are treated with liquid nitrogen therapy, frozen until ice ball extended 2 mm beyond lesion, allowed to thaw, and treated again. The patient tolerated procedure well.  The patient was instructed on post-op care, warned that there may be blister formation, redness and pain. Recommend OTC analgesia as needed for pain.  Condition: Stable  Complications: none.  Plan: 1. Instructed to keep the area dry and covered for 24-48h and clean thereafter. 2. Warning signs of infection were reviewed.   3. Recommended that the patient use OTC acetaminophen as needed for pain.    Solar lentigos- cryotherapy not worked in past. [place] failed. Will try retin A. Discussed derm referral.

## 2014-10-16 NOTE — Telephone Encounter (Signed)
For her Age spots: we can send retin A to pharmacy to use? Also laser, chemical peel, dermabrasion at derm office could also help.

## 2014-10-21 ENCOUNTER — Ambulatory Visit (INDEPENDENT_AMBULATORY_CARE_PROVIDER_SITE_OTHER): Payer: BC Managed Care – PPO | Admitting: Osteopathic Medicine

## 2014-10-21 ENCOUNTER — Encounter: Payer: Self-pay | Admitting: Osteopathic Medicine

## 2014-10-21 VITALS — BP 124/69 | HR 59 | Wt 184.0 lb

## 2014-10-21 DIAGNOSIS — N644 Mastodynia: Secondary | ICD-10-CM | POA: Diagnosis not present

## 2014-10-21 DIAGNOSIS — N6320 Unspecified lump in the left breast, unspecified quadrant: Secondary | ICD-10-CM

## 2014-10-21 DIAGNOSIS — N63 Unspecified lump in breast: Secondary | ICD-10-CM | POA: Diagnosis not present

## 2014-10-21 MED ORDER — ACETAMINOPHEN ER 650 MG PO TBCR: 1300.0000 mg | EXTENDED_RELEASE_TABLET | Freq: Three times a day (TID) | ORAL | Status: AC | PRN

## 2014-10-21 NOTE — Progress Notes (Signed)
HPI: Julia Larson is a 59 y.o. female who presents to [place]  today for chief complaint of:  Chief Complaint  Patient presents with  . Acute Visit    sharp pain near L breast also felt a hard bump    . Location: L breast . Quality: sharp brief pain, then nagging soreness . Severity: mild . Timing: constant,  . Context: took showet this morning and felt pain in arm/breast. Felt lump on L breast, carrying things and moving a few weeks ago. Hx breast reduction surgery.  . Modifying factors: . Assoc signs/symptoms: no changes to the skin. Normal Mammogram 04/02/14 results reviewed. No chest tightness or pressure, no shortness of breath, no pain radiating down left arm. No nausea/vomiting, no dizziness   Past medical, social and family history reviewed: No past medical history on file. Past Surgical History  Procedure Laterality Date  . Abdominal hysterectomy    . Cholecystectomy    . Wrist fracture surgery Right 2014  . Breast reduction surgery Bilateral 1996  . Dilation and curettage of uterus  1982  . Tonsillectomy  1973  . Breast lumpectomy  1984  . Cesarean section    . Anterior cervical decomp/discectomy fusion  1999  . Gastric bypass  2000   Social History  Substance Use Topics  . Smoking status: Former Smoker    Quit date: 11/09/1985  . Smokeless tobacco: Not on file  . Alcohol Use: 0.0 oz/week    0 Standard drinks or equivalent per week     Comment: 1-2 glasses per week (09/21/14)   Family History  Problem Relation Age of Onset  . Cancer Mother   . Cancer Father   . Alcohol abuse Sister   . Alcohol abuse Brother   . Hypertension    . Diabetes    . Neuropathy Neg Hx     Current Outpatient Prescriptions  Medication Sig Dispense Refill  . acetaminophen (TYLENOL) 650 MG CR tablet Take 2 tablets (1,300 mg total) by mouth every 8 (eight) hours as needed for pain. 90 tablet 3  . Cyanocobalamin (B-12 SL) Place under the tongue.     . Iron-FA-B Cmp-C-Biot-Probiotic (FUSION PLUS) CAPS Take one tablet daily. 30 capsule 11  . Multiple Vitamin (MULTIVITAMIN) tablet Take 1 tablet by mouth daily.    Marland Kitchen tretinoin (RETIN-A) 0.1 % cream Apply topically at bedtime. 45 g 3   No current facility-administered medications for this visit.   Allergies  Allergen Reactions  . Adhesive [Tape]     Skin blisters  . Morphine And Related   . Sulfa Antibiotics Rash      Review of Systems: CONSTITUTIONAL: Neg fever/chills HEAD/EYES/EARS/NOSE/THROAT: No headache/vision change CARDIAC: No chest pain/pressure/palpitations, no orthopnea RESPIRATORY: No cough/shortness of breath/wheeze SKIN: No rash/wounds/concerning lesions MUSCULOSKELETAL/BREAST:  HEM/ONC: no abnormal lymph node NEUROLOGIC: No weakness/dizzines  Exam:  BP 124/69 mmHg  Pulse 59  Wt 184 lb (83.462 kg)  SpO2 98% Constitutional: VSS, see above. General Appearance: alert, well-developed, well-nourished, NAD Neck: No masses, trachea midline. No thyroid enlargement/tenderness/mass appreciated Respiratory: Normal respiratory effort. no wheeze/rhonchi/rales Cardiovascular: S1/S2 normal, no murmur/rub/gallop auscultated. RRR. no lower extremity edema. BREAST: No rashes/skin changes, normal fibrous breast tissue, no masses or tenderness, normal nipple without discharge, normal axilla. Patient is however tender in breast tissue at approx 3-4:00 on L breast, possible small fibrotic change there versus calcification   No results found for this or any previous visit (from the past 72 hour(s)).  Mammogram results  reviewed: BIRADS1 04/02/2014 though this is an addendum to report which notes BIRADS0 need further evaluation of left breast however addendum was added after review for previous mammo for comparison - no change noted therefore BIRADS1  ASSESSMENT/PLAN:  Breast pain, left - Plan: US BREAST LTD UNI LEFT INC AXILLA, acetaminophen (TYLENOL) 650 MG CR tablet  Painful  lumpy left breast - Plan: US BREAST LTD UNI LEFT INC AXILLA  Primary osteoarthritis of both knees   Review of mammogram reports no interval change, though at one point there was possible concern for left breast abnormality. Will obtain ultrasound and may consider further workup based on results of this. Advised patient may also be musculoskeletal issue, referred pain from intercostal muscles. Of note patient has also had breast reduction surgery and scar tissue may be an issue as well.

## 2014-10-22 ENCOUNTER — Telehealth: Payer: Self-pay | Admitting: Physician Assistant

## 2014-10-22 DIAGNOSIS — N644 Mastodynia: Secondary | ICD-10-CM | POA: Insufficient documentation

## 2014-10-22 NOTE — Telephone Encounter (Signed)
Received fax for prior authorization on Tretinoin 0.01% gel sent through cover my meds waiting on authorization. - CF

## 2014-10-22 NOTE — Telephone Encounter (Signed)
Retin A was sent already.

## 2014-10-28 NOTE — Telephone Encounter (Signed)
Received fax from [facility] [employer] and they denied coverage due to it not being provided for cosmetic purposes. - CF

## 2015-08-04 ENCOUNTER — Encounter: Payer: Self-pay | Admitting: Physician Assistant

## 2015-08-04 ENCOUNTER — Ambulatory Visit (INDEPENDENT_AMBULATORY_CARE_PROVIDER_SITE_OTHER): Payer: BC Managed Care – PPO | Admitting: Physician Assistant

## 2015-08-04 VITALS — BP 115/53 | HR 61 | Ht 64.0 in | Wt 190.0 lb

## 2015-08-04 DIAGNOSIS — F339 Major depressive disorder, recurrent, unspecified: Secondary | ICD-10-CM | POA: Insufficient documentation

## 2015-08-04 DIAGNOSIS — D1720 Benign lipomatous neoplasm of skin and subcutaneous tissue of unspecified limb: Secondary | ICD-10-CM | POA: Diagnosis not present

## 2015-08-04 DIAGNOSIS — F329 Major depressive disorder, single episode, unspecified: Secondary | ICD-10-CM | POA: Diagnosis not present

## 2015-08-04 DIAGNOSIS — F32A Depression, unspecified: Secondary | ICD-10-CM

## 2015-08-04 DIAGNOSIS — E669 Obesity, unspecified: Secondary | ICD-10-CM | POA: Diagnosis not present

## 2015-08-04 MED ORDER — NALTREXONE-BUPROPION HCL ER 8-90 MG PO TB12
ORAL_TABLET | ORAL | 0 refills | Status: DC
Start: 2015-08-04 — End: 2015-12-10

## 2015-08-04 NOTE — Patient Instructions (Addendum)
Will refer to general surgery for lipoma of right shoulder.    Bupropion; Naltrexone extended-release tablets What is this medicine? BUPROPION; NALTREXONE (byoo PROE pee on; nal TREX one) is a combination product used to promote and maintain weight loss in obese adults or overweight adults who also have weight related medical problems. This medicine should be used with a reduced calorie diet and increased physical activity. This medicine may be used for other purposes; ask your health care provider or pharmacist if you have questions. What should I tell my health care provider before I take this medicine? They need to know if you have any of these conditions: -an eating disorder, such as anorexia or bulimia -diabetes -glaucoma -head injury -heart disease -high blood pressure -history of a drug or alcohol abuse problem -history of a tumor or infection of your brain or spine -history of stroke -history of irregular heartbeat -kidney disease -liver disease -mental illness such as bipolar disorder or psychosis -seizures -suicidal thoughts, plans, or attempt; a previous suicide attempt by you or a family member -an unusual or allergic reaction to bupropion, naltrexone, other medicines, foods, dyes, or preservatives breast-feeding -pregnant or trying to become pregnant How should I use this medicine? Take this medicine by mouth with a glass of water. Follow the directions on the prescription label. Take this medicine in the morning and in the evenings as directed by your healthcare professional. Dennis Bast can take it with or without food. Do not take with high-fat meals as this may increase your risk of seizures. Do not crush, chew, or cut these tablets. Do not take your medicine more often than directed. Do not stop taking this medicine suddenly except upon the advice of your doctor. A special MedGuide will be given to you by the pharmacist with each prescription and refill. Be sure to read this  information carefully each time. Talk to your pediatrician regarding the use of this medicine in children. Special care may be needed. Overdosage: If you think you have taken too much of this medicine contact a poison control center or emergency room at once. NOTE: This medicine is only for you. Do not share this medicine with others. What if I miss a dose? If you miss a dose, skip the missed dose and take your next tablet at the regular time. Do not take double or extra doses. What may interact with this medicine? Do not take this medicine with any of the following medications: -any prescription or street opioid drug like codiene, heroin, methadone -linezolid -MAOIs like Carbex, Eldepryl, Marplan, Nardil, and Parnate -methylene blue (injected into a vein) -other medicines that contain bupropion like Zyban or Wellbutrin This medicine may also interact with the following medications: -alcohol -certain medicines for anxiety or sleep -certain medicines for blood pressure like metoprolol, propranolol -certain medicines for depression or psychotic disturbances -certain medicines for HIV or AIDS like efavirenz, lopinavir, nelfinavir, ritonavir -certain medicines for irregular heart beat like propafenone, flecainide -certain medicines for Parkinson's disease like amantadine, levodopa -certain medicines for seizures like carbamazepine, phenytoin, phenobarbital -cimetidine -clopidogrel -cyclophosphamide -disulfiram -furazolidone -isoniazid -nicotine -orphenadrine -procarbazine -steroid medicines like prednisone or cortisone -stimulant medicines for attention disorders, weight loss, or to stay awake -tamoxifen -theophylline -thioridazine -thiotepa -ticlopidine -tramadol -warfarin This list may not describe all possible interactions. Give your health care provider a list of all the medicines, herbs, non-prescription drugs, or dietary supplements you use. Also tell them if you smoke,  drink alcohol, or use illegal drugs. Some items may interact  with your medicine. What should I watch for while using this medicine? This medicine is intended to be used in addition to a healthy diet and appropriate exercise. The best results are achieved this way. Do not increase or in any way change your dose without consulting your doctor or health care professional. Do not take this medicine with other prescription or over-the-counter weight loss products without consulting your doctor or health care professional. Your doctor should tell you to stop taking this medicine if you do not lose a certain amount of weight within the first 12 weeks of treatment. Visit your doctor or health care professional for regular checkups. Your doctor may order blood tests or other tests to see how you are doing. This medicine may affect blood sugar levels. If you have diabetes, check with your doctor or health care professional before you change your diet or the dose of your diabetic medicine. Patients and their families should watch out for new or worsening depression or thoughts of suicide. Also watch out for sudden changes in feelings such as feeling anxious, agitated, panicky, irritable, hostile, aggressive, impulsive, severely restless, overly excited and hyperactive, or not being able to sleep. If this happens, especially at the beginning of treatment or after a change in dose, call your health care professional. Avoid alcoholic drinks while taking this medicine. Drinking large amounts of alcoholic beverages, using sleeping or anxiety medicines, or quickly stopping the use of these agents while taking this medicine may increase your risk for a seizure. What side effects may I notice from receiving this medicine? Side effects that you should report to your doctor or health care professional as soon as possible: -allergic reactions like skin rash, itching or hives, swelling of the face, lips, or tongue -breathing  problems -changes in vision, hearing -chest pain -confusion -dark urine -depressed mood -fast or irregular heart beat -fever -hallucination, loss of contact with reality -increased blood pressure -light-colored stools -redness, blistering, peeling or loosening of the skin, including inside the mouth -right upper belly pain -seizures -suicidal thoughts or other mood changes -unusually weak or tired -vomiting -yellowing of the eyes or skin Side effects that usually do not require medical attention (Report these to your doctor or health care professional if they continue or are bothersome.): -constipation -diarrhea -dizziness -dry mouth -headache -nausea -trouble sleeping This list may not describe all possible side effects. Call your doctor for medical advice about side effects. You may report side effects to FDA at 1-800-FDA-1088. Where should I keep my medicine? Keep out of the reach of children. Store at room temperature between 15 and 30 degrees C (59 and 86 degrees F). Throw away any unused medicine after the expiration date. NOTE: This sheet is a summary. It may not cover all possible information. If you have questions about this medicine, talk to your doctor, pharmacist, or health care provider.    2016, Elsevier/Gold Standard. (2012-10-02 15:17:29)

## 2015-08-04 NOTE — Progress Notes (Signed)
   Subjective:    Patient ID: Julia Larson, female    DOB: 02-20-1955, 60 y.o.   MRN: [id number]  HPI  Pt is a 60 yo female who presents to the clinic to follow up on lipoma of right shoulder. Saw sports med doctor in 03/2014 and shoulder was injected in lipoma with steriod. ROM, size and pain went down signifcantly. About 4 months ago seemed to start growing again. ROM is not effected by pain. She would like to consider removal.   She would also like to discuss weight. She had gastric bypass about 8 years ago and lost 167lbs and was down to 157. She is now back up to 190. She thinks this is also effecting her mood. She just doesn't feel good about herself. Last a1c at [facility] was 5. She is walking about 2 miles every other day and swimming 10-15 laps a day in her pool.     Review of Systems  All other systems reviewed and are negative.      Objective:   Physical Exam  Constitutional: She is oriented to person, place, and time. She appears well-developed and well-nourished.  Obesity.   HENT:  Head: Normocephalic and atraumatic.  Cardiovascular: Normal rate, regular rhythm and normal heart sounds.   Pulmonary/Chest: Effort normal and breath sounds normal.  Musculoskeletal:  Right posterior shoulder 1.5cm by 2cm mobile nodule.   Neurological: She is alert and oriented to person, place, and time.  Psychiatric: She has a normal mood and affect. Her behavior is normal.          Assessment & Plan:  Lipoma of right shoulder- had past injection which helped but mass came by and seems to be more painful. Will refer to general surgery.   Obesity/hx of gastric bypass- discussed options. Does not want anything that would cause her to "feel jacked up". Start contrave. Discussed side effects and taper up on dose. Coupon given. Follow up in 3 months with weight. Discussed diet and exercise.   Depression-PHQ-9 was 4.  I think the wellbutrin in contrave will help with this. I also think weight could  be effecting mood. Hopefully she will start losing weigh and then feel better.   Labs recently drawn at [facility]. Will attempt to get records.

## 2015-08-10 ENCOUNTER — Telehealth: Payer: Self-pay | Admitting: *Deleted

## 2015-08-10 NOTE — Telephone Encounter (Signed)
Left message advising of recommendations.  

## 2015-08-10 NOTE — Telephone Encounter (Signed)
Pt left vm stating that the Wellbutrin is going to cost $90/month.

## 2015-08-10 NOTE — Telephone Encounter (Signed)
Go to [facility] and print off coupon and can get at [facility] for 23.18 for 30 tablets or [facility] for 21.49 or [facility] for 26.58.

## 2015-08-11 NOTE — Telephone Encounter (Signed)
rtn pt's call about wellbutrin and advised that she print off coupon for this and call back to let us know where she like to have this sent.Elouise Munroe'

## 2015-08-11 NOTE — Telephone Encounter (Signed)
Pt called and stated that she thought that [place] did not want her on the wellbutrin only. She also stated that there was supposed to be a PA started for the contrave. Please advise pt stated it would be ok to lvm regarding this.Julia Larson [place]

## 2015-11-03 ENCOUNTER — Ambulatory Visit: Payer: BC Managed Care – PPO | Admitting: Physician Assistant

## 2015-11-05 ENCOUNTER — Ambulatory Visit: Payer: BLUE CROSS/BLUE SHIELD | Admitting: Physician Assistant

## 2015-11-17 ENCOUNTER — Ambulatory Visit (INDEPENDENT_AMBULATORY_CARE_PROVIDER_SITE_OTHER): Payer: BC Managed Care – PPO | Admitting: Family Medicine

## 2015-11-17 ENCOUNTER — Encounter: Payer: Self-pay | Admitting: Family Medicine

## 2015-11-17 VITALS — BP 120/64 | HR 68 | Ht 64.0 in | Wt 187.0 lb

## 2015-11-17 DIAGNOSIS — B3749 Other urogenital candidiasis: Secondary | ICD-10-CM | POA: Diagnosis not present

## 2015-11-17 DIAGNOSIS — N760 Acute vaginitis: Secondary | ICD-10-CM | POA: Diagnosis not present

## 2015-11-17 LAB — WET PREP, GENITAL
CLUE CELLS WET PREP: NONE SEEN
Trich, Wet Prep: NONE SEEN
Yeast Wet Prep HPF POC: NONE SEEN

## 2015-11-17 MED ORDER — FLUCONAZOLE 150 MG PO TABS: 150.0000 mg | ORAL_TABLET | ORAL | 0 refills | Status: DC

## 2015-11-17 NOTE — Progress Notes (Signed)
Subjective:    CC:   HPI: Patient symptoms initially started with rectal itching 1 month. She says it felt like when she had pinworms as a child. She went to the [place] for care. Told to use Tucks pad but that was irritatting.  Then told to use vagisil wipes and those were irritating as well. She was then referred to GI. She feels like the rectal itching has improved but now she is having itching soreness and pain over the entire groin area up onto the labia.   She has tried hydrocortisone 2.5% cream, clotrimazole 1% cream, and zinc oxide.   Not currently sexually active.  She did try a new detergent but quit that.she feels the rash is spreading up her buttock crease.  She has missed work a couple of times because she is so uncomfortable. She is intensely itching.    BP 120/64   Pulse 68   Ht 5\' 4"  (1.626 m)   Wt 187 lb (84.8 kg)   BMI 32.10 kg/m     Allergies  Allergen Reactions  . Adhesive [Tape]     Skin blisters  . Morphine And Related   . Sulfa Antibiotics Rash    No past medical history on file.  Past Surgical History:  Procedure Laterality Date  . ABDOMINAL HYSTERECTOMY    . ANTERIOR CERVICAL DECOMP/DISCECTOMY FUSION  1999  . BREAST LUMPECTOMY  1984  . BREAST REDUCTION SURGERY Bilateral 1996  . CESAREAN SECTION    . CHOLECYSTECTOMY    . DILATION AND CURETTAGE OF UTERUS  1982  . GASTRIC BYPASS  2000  . TONSILLECTOMY  1973  . WRIST FRACTURE SURGERY Right 2014    Social History   Social History  . Marital status: Single    Spouse name: N/A  . Number of children: 2  . Years of education: 12+   Occupational History  . [employer]    Social History Main Topics  . Smoking status: Former Smoker    Quit date: 11/09/1985  . Smokeless tobacco: Not on file  . Alcohol use 0.0 oz/week     Comment: 1-2 glasses per week (09/21/14)  . Drug use: No  . Sexual activity: Not Currently   Other Topics Concern  . Not on file   Social History Narrative   Lives at home  with brother and sister-in-law   Caffeine use: 1-2 cups per day    Family History  Problem Relation Age of Onset  . Cancer Mother   . Cancer Father   . Alcohol abuse Sister   . Alcohol abuse Brother   . Hypertension    . Diabetes    . Neuropathy Neg Hx     Outpatient Encounter Prescriptions as of 11/17/2015  Medication Sig  . acetaminophen (TYLENOL) 650 MG CR tablet Take 2 tablets (1,300 mg total) by mouth every 8 (eight) hours as needed for pain.  . Multiple Vitamin (MULTIVITAMIN) tablet Take 1 tablet by mouth daily.  . Naltrexone-Bupropion HCl ER 8-90 MG TB12 Take 1 tablet in am for 1 week, then increase to 1 tablet in am and 1 tablet in pm for 1 week, then increase to 2 tablets in am and 1 tablet in pm for 1 week, then increase to 2 tablets in am and 2 tablets in pm daily.  . fluconazole (DIFLUCAN) 150 MG tablet Take 1 tablet (150 mg total) by mouth every other day.  . [DISCONTINUED] fluconazole (DIFLUCAN) 150 MG tablet Take 1 tablet (150  mg total) by mouth every other day.   No facility-administered encounter medications on file as of 11/17/2015.        Objective:    Physical Exam  Constitutional: She is oriented to person, place, and time. She appears well-developed and well-nourished.  HENT:  Head: Normocephalic and atraumatic.  Eyes: Conjunctivae and EOM are normal.  Cardiovascular: Normal rate.   Pulmonary/Chest: Effort normal.  Genitourinary: Vagina normal. There is rash on the right labia. There is no tenderness, lesion or injury on the right labia. There is rash on the left labia. There is no tenderness, lesion or injury on the left labia. Right adnexum displays no mass, no tenderness and no fullness. Left adnexum displays no mass, no tenderness and no fullness.  Genitourinary Comments: Cervix absent. She has a dry scaling erythematous rash on the inner labs and around the rectum and along the buttock crease. Some satellite lesions around edge of rash. Area around  rectum looks more moist.    Neurological: She is alert and oriented to person, place, and time.  Skin: Skin is dry. No pallor.  Psychiatric: She has a normal mood and affect. Her behavior is normal.  Vitals reviewed.     Impression and Recommendations:    Candidiasis perineum - Will treat with oral Diflucan. One tab every other day 3 doses. I did do a skin scraping of the right labia since she had some scale there. We also did a vaginal wet prep.

## 2015-11-19 LAB — FUNGAL STAIN

## 2015-12-10 ENCOUNTER — Ambulatory Visit (INDEPENDENT_AMBULATORY_CARE_PROVIDER_SITE_OTHER): Payer: BC Managed Care – PPO | Admitting: Family Medicine

## 2015-12-10 ENCOUNTER — Encounter: Payer: Self-pay | Admitting: Family Medicine

## 2015-12-10 VITALS — BP 129/65 | HR 55 | Temp 98.5°F | Ht 64.0 in | Wt 188.0 lb

## 2015-12-10 DIAGNOSIS — J069 Acute upper respiratory infection, unspecified: Secondary | ICD-10-CM

## 2015-12-10 LAB — POCT INFLUENZA A/B
Influenza A, POC: NEGATIVE
Influenza B, POC: NEGATIVE

## 2015-12-10 NOTE — Patient Instructions (Addendum)

## 2015-12-10 NOTE — Progress Notes (Signed)
   Subjective:    Patient ID: Julia Larson, female    DOB: 11/24/1955, 60 y.o.   MRN: [id number]  HPI 60 yo female comes in today complaining of URI x 5 days.  Taking Tylenol cold and Flu and lozenges.  + chills.  Fever 101 on Monday.  Last high fever was on Wed.   Cough is mostly dry. ST with post nasal drip.  + diarrhea couple of days.  Was at [facility] yesterday.  She is concerned she has the flu.    Review of Systems     Objective:   Physical Exam  Constitutional: She is oriented to person, place, and time. She appears well-developed and well-nourished.  HENT:  Head: Normocephalic and atraumatic.  Right Ear: External ear normal.  Left Ear: External ear normal.  Nose: Nose normal.  Mouth/Throat: Oropharynx is clear and moist.  TMs and canals are clear.   Eyes: Conjunctivae and EOM are normal. Pupils are equal, round, and reactive to light.  Neck: Neck supple. No thyromegaly present.  Cardiovascular: Normal rate, regular rhythm and normal heart sounds.   Pulmonary/Chest: Effort normal and breath sounds normal. She has no wheezes.  Lymphadenopathy:    She has no cervical adenopathy.  Neurological: She is alert and oriented to person, place, and time.  Skin: Skin is warm and dry.  Psychiatric: She has a normal mood and affect.       Assessment & Plan:  URI - Viral. Negative flu test. Gave reassurance. Continue since Medicare. Work note given for yesterday and today's she was running a fever. Hopefully she should be well enough to return to work on Monday.

## 2016-03-02 ENCOUNTER — Ambulatory Visit (INDEPENDENT_AMBULATORY_CARE_PROVIDER_SITE_OTHER): Payer: BC Managed Care – PPO | Admitting: Family Medicine

## 2016-03-02 ENCOUNTER — Encounter: Payer: Self-pay | Admitting: Family Medicine

## 2016-03-02 VITALS — BP 132/79 | HR 101 | Temp 99.2°F | Ht 64.0 in | Wt 191.0 lb

## 2016-03-02 DIAGNOSIS — J111 Influenza due to unidentified influenza virus with other respiratory manifestations: Secondary | ICD-10-CM

## 2016-03-02 MED ORDER — OSELTAMIVIR PHOSPHATE 75 MG PO CAPS: 75.0000 mg | ORAL_CAPSULE | Freq: Every day | ORAL | 0 refills | Status: DC

## 2016-03-02 NOTE — Patient Instructions (Addendum)

## 2016-03-02 NOTE — Progress Notes (Signed)
   Subjective:    Patient ID: Julia Larson, female    DOB: 10-Jun-1955, 61 y.o.   MRN: [id number]  HPI 61 year old female comes in today with symptoms that started yesterday with a sore throat nasal congestion and facial pain and headache. She now 6 parents and fever and body aches. She took some over-the-counter NyQuil. She's now getting an oragne brown drainage from her nose.  She's also complaining of some chest tightness but no actual pain and some intermittent dizziness especially with change of position over the last 2 weeks.  Review of Systems     Objective:   Physical Exam  Constitutional: She is oriented to person, place, and time. She appears well-developed and well-nourished.  HENT:  Head: Normocephalic and atraumatic.  Right Ear: External ear normal.  Left Ear: External ear normal.  Nose: Nose normal.  Mouth/Throat: Oropharynx is clear and moist.  TMs and canals are clear.   Eyes: Conjunctivae and EOM are normal. Pupils are equal, round, and reactive to light.  Neck: Neck supple. No thyromegaly present.  Cardiovascular: Normal rate, regular rhythm and normal heart sounds.   Pulmonary/Chest: Effort normal and breath sounds normal. She has no wheezes.  Lymphadenopathy:    She has no cervical adenopathy.  Neurological: She is alert and oriented to person, place, and time.  Skin: Skin is warm and dry.  Psychiatric: She has a normal mood and affect.        Assessment & Plan:  Influenza-based on diagnostic criteria do think she has influenza. Will treat with Tamiflu. Make sure hydrating well. Okay to use over-the-counter cough and cold medications.  Encouraged her to schedule an appointment on Monday with her PCP for the dizziness and chest pressure. Go to ED if gets worse.

## 2016-03-06 ENCOUNTER — Ambulatory Visit (INDEPENDENT_AMBULATORY_CARE_PROVIDER_SITE_OTHER): Payer: BC Managed Care – PPO | Admitting: Physician Assistant

## 2016-03-06 ENCOUNTER — Encounter: Payer: Self-pay | Admitting: Physician Assistant

## 2016-03-06 VITALS — BP 102/69 | HR 90 | Temp 97.9°F | Ht 64.0 in | Wt 188.0 lb

## 2016-03-06 DIAGNOSIS — R234 Changes in skin texture: Secondary | ICD-10-CM

## 2016-03-06 DIAGNOSIS — R0789 Other chest pain: Secondary | ICD-10-CM | POA: Diagnosis not present

## 2016-03-06 DIAGNOSIS — R5383 Other fatigue: Secondary | ICD-10-CM | POA: Diagnosis not present

## 2016-03-06 DIAGNOSIS — R531 Weakness: Secondary | ICD-10-CM

## 2016-03-06 DIAGNOSIS — R42 Dizziness and giddiness: Secondary | ICD-10-CM | POA: Diagnosis not present

## 2016-03-06 MED ORDER — ZINC OXIDE 30 % EX OINT
TOPICAL_OINTMENT | CUTANEOUS | 1 refills | Status: DC
Start: 2016-03-06 — End: 2016-04-21

## 2016-03-06 MED ORDER — OMEPRAZOLE 40 MG PO CPDR: 40.0000 mg | DELAYED_RELEASE_CAPSULE | Freq: Every day | ORAL | 1 refills | Status: DC

## 2016-03-06 MED ORDER — NYSTATIN 100000 UNIT/GM EX POWD: Freq: Four times a day (QID) | CUTANEOUS | 1 refills | Status: DC

## 2016-03-06 NOTE — Progress Notes (Addendum)
Subjective:     Patient ID: Julia Larson, female   DOB: Nov 21, 1955, 61 y.o.   MRN: [id number]  HPI  This is a 61 year old female who presents for follow up. She was diagnosed clinically with the flu 4 days ago and at this time complained of weakness and dizziness and was told to follow up with her PCP this weak.  She reports the weakness and dizziness began 2-3 weeks ago and is worsened with standing up after being seated. Usually drinks 40-60 ounces a day of water.   Also complains of chest tightness, but denies actual pain. Has history of acid reflux but states this feels different. Has been under increased stress at work and believes this plays a large role in her chest tightness. States at times exacerbated by eating but other times tightness is unprovoked. Endorses SOB but states this began after she got the flu. Denies palpitations, edema.  She also relays she has been feeling tired for a couple years and was previously started on Vitamin B12 and Vitamin D however she is no longer taking either of these and states she did not tolerate Vitamin D well.   History of Pilonidal cyst. Has noticed blood and pus on the back of her bottom. A couple months ago was treated prophylactic for yeast with diflucan however culture revealed it was not yeast. Did go away for a few weeks but rash has since returned. Has been using Desitin daily for rash.   Also complains of difficulty hearing and states she has noticed increased wax in her ears. Request ear lavage.  Review of Systems All other ROS negative except those noted in the HPI.    Objective:   Physical Exam  Constitutional: She is oriented to person, place, and time. She appears well-developed and well-nourished.  HENT:  Head: Normocephalic and atraumatic.  Nose: Nose normal.  Mouth/Throat: Oropharynx is clear and moist. No oropharyngeal exudate.  Tympanic membranes obscured bilaterally from cerumen.  Neck: Normal range of motion. Neck supple. No  thyromegaly present.  Cardiovascular: Normal rate and regular rhythm.  Exam reveals no gallop and no friction rub.   No murmur heard. Pulmonary/Chest: Effort normal and breath sounds normal. She has no wheezes. She has no rales. She exhibits no tenderness.  Abdominal: Soft. Bowel sounds are normal. There is no tenderness. There is no rebound and no guarding.  Musculoskeletal: Normal range of motion. She exhibits no edema.  Lymphadenopathy:    She has no cervical adenopathy.  Neurological: She is alert and oriented to person, place, and time. Coordination normal.  Skin: Skin is warm and dry. Rash noted.     Psychiatric: She has a normal mood and affect. Her behavior is normal.       Assessment/Plan:  Diagnoses and all orders for this visit:  Dizziness -     CBC -     Comprehensive metabolic panel -     C-reactive protein -     Ferritin -     Sedimentation rate -     TSH -     Vitamin B12 -     Vit D  25 hydroxy (rtn osteoporosis monitoring)  Weakness -     CBC -     Comprehensive metabolic panel -     C-reactive protein -     Ferritin -     Sedimentation rate -     TSH -     Vitamin B12 -     Vit D  25 hydroxy (rtn osteoporosis monitoring)  Lack of energy -     CBC -     Comprehensive metabolic panel -     C-reactive protein -     Ferritin -     Sedimentation rate -     TSH -     Vitamin B12 -     Vit D  25 hydroxy (rtn osteoporosis monitoring)  Chest tightness -     CBC -     Comprehensive metabolic panel -     C-reactive protein -     Ferritin -     Sedimentation rate -     TSH -     Vitamin B12 -     Vit D  25 hydroxy (rtn osteoporosis monitoring)  Atypical chest pain  Cracked skin  Other orders -     Zinc Oxide 30 % OINT; Apply twice a day to affected area. -     nystatin (NYSTATIN) powder; Apply topically 4 (four) times daily. -     omeprazole (PRILOSEC) 40 MG capsule; Take 1 capsule (40 mg total) by mouth daily.  - Dizziness, weakness: Fatigue  lab panel ordered.  - Chest tightness: likely a combination of anxiety and gastric reflux given history and timing of tightness. Prescribed PPI and advised her to be mindful of precipitating factors and stress management. EKG normal today. If continues to have pain could consider stress test.  - Cracked skin: zinc oxide and nystatin powder prescribed to help keep the area dry and help with maceration. Instructed patient to call or come back in if area worsens or fails to improve. - Wrote patient out for work today and tomorrow given still recovering from influenza. - Cerumen removed bilaterally.  Indication: Cerumen impaction of the ear(s)  Medical necessity statement: On physical examination, cerumen impairs clinically significant portions of the external auditory canal, and tympanic membrane. Noted obstructive, copious cerumen that cannot be removed without magnification and instrumentations requiring physician skills Consent: Discussed benefits and risks of procedure and verbal consent obtained Procedure: Patient was prepped for the procedure. Utilized an otoscope to assess and take note of the ear canal, the tympanic membrane, and the presence, amount, and placement of the cerumen. Gentle water irrigation and soft plastic curette was utilized to remove cerumen.  Post procedure examination: shows cerumen was completely removed. Patient tolerated procedure well. The patient is made aware that they may experience temporary vertigo, temporary hearing loss, and temporary discomfort. If these symptom last for more than 24 hours to call the clinic or proceed to the ED.

## 2016-03-06 NOTE — Patient Instructions (Signed)
Start omeprazole.  Get labs.

## 2016-03-07 ENCOUNTER — Other Ambulatory Visit: Payer: Self-pay | Admitting: *Deleted

## 2016-03-07 LAB — CBC
HCT: 47.2 % — ABNORMAL HIGH (ref 35.0–45.0)
Hemoglobin: 15.3 g/dL (ref 11.7–15.5)
MCH: 27.3 pg (ref 27.0–33.0)
MCHC: 32.4 g/dL (ref 32.0–36.0)
MCV: 84.3 fL (ref 80.0–100.0)
MPV: 10.4 fL (ref 7.5–12.5)
PLATELETS: 211 10*3/uL (ref 140–400)
RBC: 5.6 MIL/uL — ABNORMAL HIGH (ref 3.80–5.10)
RDW: 14.6 % (ref 11.0–15.0)
WBC: 5.8 10*3/uL (ref 3.8–10.8)

## 2016-03-07 LAB — VITAMIN D 25 HYDROXY (VIT D DEFICIENCY, FRACTURES): Vit D, 25-Hydroxy: 18 ng/mL — ABNORMAL LOW (ref 30–100)

## 2016-03-07 LAB — COMPREHENSIVE METABOLIC PANEL
ALT: 17 U/L (ref 6–29)
AST: 20 U/L (ref 10–35)
Albumin: 4.1 g/dL (ref 3.6–5.1)
Alkaline Phosphatase: 92 U/L (ref 33–130)
BILIRUBIN TOTAL: 0.3 mg/dL (ref 0.2–1.2)
BUN: 15 mg/dL (ref 7–25)
CALCIUM: 9.6 mg/dL (ref 8.6–10.4)
CO2: 33 mmol/L — AB (ref 20–31)
CREATININE: 0.97 mg/dL (ref 0.50–1.05)
Chloride: 106 mmol/L (ref 98–110)
GLUCOSE: 98 mg/dL (ref 65–99)
Potassium: 5.2 mmol/L (ref 3.5–5.3)
SODIUM: 145 mmol/L (ref 135–146)
Total Protein: 6.8 g/dL (ref 6.1–8.1)

## 2016-03-07 LAB — SEDIMENTATION RATE: SED RATE: 5 mm/h (ref 0–30)

## 2016-03-07 LAB — VITAMIN B12: Vitamin B-12: 302 pg/mL (ref 200–1100)

## 2016-03-07 LAB — C-REACTIVE PROTEIN: CRP: 4.7 mg/L (ref <8.0)

## 2016-03-07 LAB — TSH: TSH: 1.89 mIU/L

## 2016-03-07 LAB — FERRITIN: FERRITIN: 25 ng/mL (ref 10–232)

## 2016-03-07 MED ORDER — VITAMIN D (ERGOCALCIFEROL) 1.25 MG (50000 UNIT) PO CAPS: 50000.0000 [IU] | ORAL_CAPSULE | ORAL | 2 refills | Status: DC

## 2016-04-21 ENCOUNTER — Ambulatory Visit (INDEPENDENT_AMBULATORY_CARE_PROVIDER_SITE_OTHER): Payer: BC Managed Care – PPO | Admitting: Family Medicine

## 2016-04-21 ENCOUNTER — Encounter: Payer: Self-pay | Admitting: Family Medicine

## 2016-04-21 VITALS — BP 140/81 | HR 91 | Ht 64.0 in | Wt 191.0 lb

## 2016-04-21 DIAGNOSIS — R5383 Other fatigue: Secondary | ICD-10-CM

## 2016-04-21 DIAGNOSIS — R140 Abdominal distension (gaseous): Secondary | ICD-10-CM

## 2016-04-21 DIAGNOSIS — R195 Other fecal abnormalities: Secondary | ICD-10-CM | POA: Diagnosis not present

## 2016-04-21 NOTE — Patient Instructions (Signed)
Stop your vitamin D, B12 and iron for the next week just to see if he starts feeling any better. In the meantime we'll place referral for colonoscopy.

## 2016-04-21 NOTE — Progress Notes (Addendum)
Subjective:    Patient ID: Julia Larson, female    DOB: [birth year], 61 y.o.   MRN: [id number]  HPI 61 year old female comes in today concerned because she's noticed some changes in her bowels. She was recently started on iron has noticed that her stools are greenish tinged and more recently has started to look at them as black. She says before they were thin neck like shaped stools and now it's mostly pudding  consistency.As the feels like there are gas pockets in her up to her upper abdomen. The only thing that she's done differently as she started a vitamin D supplement, vitamin B12 supplement and iron supplement daily. She was slightly deficient in these things. 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 She's had multiple family members with colon cleans cancer including 2 maternal aunts and thinks that her mother likely had it that she was originally diagnosed with lung cancer. Her last colonoscopy was about 9 years ago   Review of Systems  She's been more fatigued than usual.  BP 140/81   Pulse 91   Ht 5\' 4"  (1.626 m)   Wt 191 lb (86.6 kg)   BMI 32.79 kg/m     Allergies  Allergen Reactions  . Adhesive [Tape]     Skin blisters  . Morphine And Related   . Sulfa Antibiotics Rash    No past medical history on file.  Past Surgical History:  Procedure Laterality Date  . ABDOMINAL HYSTERECTOMY    . ANTERIOR CERVICAL DECOMP/DISCECTOMY FUSION  1999  . BREAST LUMPECTOMY  1984  . BREAST REDUCTION SURGERY Bilateral 1996  . CESAREAN SECTION    . CHOLECYSTECTOMY    . DILATION AND CURETTAGE OF UTERUS  1982  . GASTRIC BYPASS  2000  . TONSILLECTOMY  1973  . WRIST FRACTURE SURGERY Right 2014    Social History   Social History  . Marital status: Single    Spouse name: N/A  . Number of children: 2  . Years of education: 12+   Occupational History  . [employer]    Social History Main Topics  . Smoking status: Former Smoker    Quit date: 11/09/1985   . Smokeless tobacco: Never Used  . Alcohol use 0.0 oz/week     Comment: 1-2 glasses per week (09/21/14)  . Drug use: No  . Sexual activity: Not Currently   Other Topics Concern  . Not on file   Social History Narrative   Lives at home with brother and sister-in-law   Caffeine use: 1-2 cups per day    Family History  Problem Relation Age of Onset  . Lung cancer Mother   . Cancer Father   . Alcohol abuse Sister   . Alcohol abuse Brother   . Hypertension    . Diabetes    . Colon cancer Maternal Aunt   . Colon cancer Maternal Aunt   . Neuropathy Neg Hx     Outpatient Encounter Prescriptions as of 04/21/2016  Medication Sig  . acetaminophen (TYLENOL) 650 MG CR tablet Take 2 tablets (1,300 mg total) by mouth every 8 (eight) hours as needed for pain.  . ferrous sulfate 325 (65 FE) MG tablet Take 325 mg by mouth daily with breakfast.  . Multiple Vitamin (MULTIVITAMIN) tablet Take 1 tablet by mouth daily.  Marland Kitchen omeprazole (PRILOSEC) 40 MG capsule Take 1 capsule (40 mg total) by mouth daily.  . vitamin B-12 (CYANOCOBALAMIN) 1000 MCG tablet Take 1,000  mcg by mouth daily.  . Vitamin D, Ergocalciferol, (DRISDOL) 50000 units CAPS capsule Take 1 capsule (50,000 Units total) by mouth every 7 (seven) days.  . [DISCONTINUED] nystatin (NYSTATIN) powder Apply topically 4 (four) times daily.  . [DISCONTINUED] Zinc Oxide 30 % OINT Apply twice a day to affected area.   No facility-administered encounter medications on file as of 04/21/2016.          Objective:   Physical Exam  Constitutional: She is oriented to person, place, and time. She appears well-developed and well-nourished.  HENT:  Head: Normocephalic and atraumatic.  Neck: Normal range of motion. Neck supple. No thyromegaly present.  Cardiovascular: Normal rate, regular rhythm and normal heart sounds.   Pulmonary/Chest: Effort normal and breath sounds normal.  Abdominal: Soft. Bowel sounds are normal. She exhibits no distension and  no mass. There is tenderness. There is no rebound and no guarding.  + TTP in the epigastric areas  Lymphadenopathy:    She has no cervical adenopathy.  Neurological: She is alert and oriented to person, place, and time.  Skin: Skin is warm and dry.  Psychiatric: She has a normal mood and affect. Her behavior is normal.          Assessment & Plan:  Change in stools, dark stools/fatigue-unclear etiology at this point. I want to recheck hemoglobin to make sure that she's not having a slow GI bleed. She did bring in a small stool samples related to a stool guaiac on that. Undergo ahead and refer her for colonoscopy for further workup and evaluation.  Bloating-oma have her stop the vitamin D, B12 and iron for the next week to see if she actually feels better.

## 2016-04-22 LAB — CBC
HEMATOCRIT: 43.4 % (ref 35.0–45.0)
HEMOGLOBIN: 14 g/dL (ref 11.7–15.5)
MCH: 27 pg (ref 27.0–33.0)
MCHC: 32.3 g/dL (ref 32.0–36.0)
MCV: 83.6 fL (ref 80.0–100.0)
MPV: 10.4 fL (ref 7.5–12.5)
Platelets: 165 10*3/uL (ref 140–400)
RBC: 5.19 MIL/uL — ABNORMAL HIGH (ref 3.80–5.10)
RDW: 15.5 % — AB (ref 11.0–15.0)
WBC: 4.5 10*3/uL (ref 3.8–10.8)

## 2016-04-25 ENCOUNTER — Encounter: Payer: Self-pay | Admitting: Internal Medicine

## 2016-04-25 ENCOUNTER — Other Ambulatory Visit: Payer: Self-pay | Admitting: Internal Medicine

## 2016-04-25 ENCOUNTER — Ambulatory Visit (INDEPENDENT_AMBULATORY_CARE_PROVIDER_SITE_OTHER): Payer: BC Managed Care – PPO | Admitting: Internal Medicine

## 2016-04-25 VITALS — BP 120/78 | HR 62 | Ht 64.0 in | Wt 193.0 lb

## 2016-04-25 DIAGNOSIS — Z1211 Encounter for screening for malignant neoplasm of colon: Secondary | ICD-10-CM

## 2016-04-25 DIAGNOSIS — R194 Change in bowel habit: Secondary | ICD-10-CM

## 2016-04-25 DIAGNOSIS — Z800 Family history of malignant neoplasm of digestive organs: Secondary | ICD-10-CM | POA: Diagnosis not present

## 2016-04-25 DIAGNOSIS — Z9884 Bariatric surgery status: Secondary | ICD-10-CM

## 2016-04-25 NOTE — Progress Notes (Signed)
Julia Larson 61 y.o. Julia Larson [id number] Referred by: Julia Larson, *  Assessment & Plan:   Encounter Diagnoses  Name Primary?  . Colon cancer screening Yes  . Family history of colon cancer - multiple second and third-dgeree relatives   . Change in bowel habit - related to medications - resolved   . 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 Suspect some anxiety over younger cousin's dx contributing. The risks and benefits as well as alternatives of endoscopic procedure(s) have been discussed and reviewed. All questions answered. The patient agrees to proceed.  The bowel changes were related to vitaminsIn my opinion, probably the ferrous sulfate more than anything would be my bet. Restart MVI and add vit D and then B12 over time If sxs return stop  I appreciate the opportunity to care for this patient. CC: Julia Planas, PA-C Dr. Madilyn Larson   Subjective:   Chief Complaint:Change in bowels  HPI Is a very nice 61 year old white woman with a history of a laparoscopic Roux-en-Y gastric bypass about 10 years ago, and is recently found to be slightly deficient and some of her vitamins, versus low normal and was started on iron supplementation B12 supplementation and high-dose vitamin D weekly. Sometime after that and a week or 2 she started having dark stools they were softer and looser than normal, and she became concerned. She saw Dr. Madilyn Larson and was referred for evaluation. The patient also has significant concerns because of multiple family members with colon cancer, her grandmother and 3 great aunts and 2 maternal aunts and a cousin on the maternal side all of that colon cancer. The female cousin was recently diagnosed with stage IV colon cancer. Patient's mother had lung cancer she thinks but had GI issues as well which also concerns the patient. She stopped all other vitamin supplements and the  bowel habit changes have ceased.  Allergies  Allergen Reactions  . Adhesive [Tape]     Skin blisters  . Morphine And Related   . Sulfa Antibiotics Rash   Current Meds  Medication Sig  . acetaminophen (TYLENOL) 650 MG CR tablet Take 2 tablets (1,300 mg total) by mouth every 8 (eight) hours as needed for pain.  Marland Kitchen omeprazole (PRILOSEC) 40 MG capsule Take 1 capsule (40 mg total) by mouth daily.   Past Medical History:  Diagnosis Date  . Depression   . Influenza   . Lipoma of shoulder   . OA (osteoarthritis)    both knees, right hip  . Obesity    Past Surgical History:  Procedure Laterality Date  . ABDOMINAL HYSTERECTOMY    . ANTERIOR CERVICAL DECOMP/DISCECTOMY FUSION  1999  . BREAST LUMPECTOMY  1984  . BREAST REDUCTION SURGERY Bilateral 1996  . CESAREAN SECTION    . CHOLECYSTECTOMY    . COLONOSCOPY    . DILATION AND CURETTAGE OF UTERUS  1982  . GASTRIC BYPASS  2000  . TONSILLECTOMY  1973  . WRIST FRACTURE SURGERY Right 2014   Social History   Social History  . Marital status: Single    Spouse name: N/A  . Number of children: 2  . Years of education: 12+   Occupational History  . [employer]    Social History Main Topics  . Smoking status: Former Smoker    Quit date: 11/09/1985  . Smokeless tobacco: Never Used  . Alcohol use 0.0 oz/week  Comment: 1-2 glasses per week (09/21/14)  . Drug use: No  . Sexual activity: Not Currently   Other Topics Concern  . 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  Review of Systems She has fatigue and insomnia. All other review of systems negative. Or as per history of present illness.  Objective:    Physical Exam @BP  120/78   Pulse 62   Ht 5\' 4"  (1.626 m)   Wt 193 lb (87.5 kg)   BMI 33.13 kg/m @  General:  Well-developed, well-nourished and in no acute distress Eyes:  anicteric. ENT:   Mouth and posterior pharynx free of lesions.  Neck:   supple w/o thyromegaly or mass.  Lungs: Clear to auscultation bilaterally. Heart:  S1S2, no rubs, murmurs, gallops. Abdomen:  soft,, no hepatosplenomegaly, hernia, or mass and BS+. Mildly tender LUQ and infraumbilical Rectal: deferred Lymph:  no cervical or supraclavicular adenopathy. Extremities:   no edema, cyanosis or clubbing Skin   no rash. Neuro:  A&O x 3.  Psych:  appropriate mood and  Affect.   Data Reviewed: PCP notes, labs

## 2016-04-25 NOTE — Patient Instructions (Signed)
You have been scheduled for a colonoscopy. Please follow written instructions given to you at your visit today.  Please use the sample kit we are giving you today (Clenpiq) If you use inhalers (even only as needed), please bring them with you on the day of your procedure. Your physician has requested that you go to www.startemmi.com and enter the access code given to you at your visit today. This web site gives a general overview about your procedure. However, you should still follow specific instructions given to you by our office regarding your preparation for the procedure.    Don't re-start your iron per Dr Carlean Purl.   Re-start your Multiple vitamins and then gradually add back your other vitamins one at a time.  If they bother you stop them.   I appreciate the opportunity to care for you. Silvano Rusk, MD, [facility]

## 2016-05-02 ENCOUNTER — Encounter: Payer: Self-pay | Admitting: Internal Medicine

## 2016-05-15 ENCOUNTER — Telehealth: Payer: Self-pay

## 2016-05-15 ENCOUNTER — Encounter: Payer: Self-pay | Admitting: Internal Medicine

## 2016-05-15 ENCOUNTER — Telehealth: Payer: Self-pay | Admitting: Internal Medicine

## 2016-05-15 ENCOUNTER — Ambulatory Visit (AMBULATORY_SURGERY_CENTER): Payer: BC Managed Care – PPO | Admitting: Internal Medicine

## 2016-05-15 VITALS — BP 109/66 | HR 66 | Temp 97.8°F | Resp 13 | Ht 64.0 in | Wt 193.0 lb

## 2016-05-15 DIAGNOSIS — Z1212 Encounter for screening for malignant neoplasm of rectum: Secondary | ICD-10-CM | POA: Diagnosis not present

## 2016-05-15 DIAGNOSIS — Z1211 Encounter for screening for malignant neoplasm of colon: Secondary | ICD-10-CM

## 2016-05-15 DIAGNOSIS — R10817 Generalized abdominal tenderness: Secondary | ICD-10-CM

## 2016-05-15 DIAGNOSIS — D122 Benign neoplasm of ascending colon: Secondary | ICD-10-CM | POA: Diagnosis not present

## 2016-05-15 DIAGNOSIS — R1030 Lower abdominal pain, unspecified: Secondary | ICD-10-CM

## 2016-05-15 MED ORDER — SODIUM CHLORIDE 0.9 % IV SOLN: 500.0000 mL | INTRAVENOUS | Status: DC

## 2016-05-15 NOTE — Telephone Encounter (Signed)
-----   Message from Gatha Mayer, MD sent at 05/15/2016  8:12 AM EDT ----- Regarding: CT Needs CT abd/pelvis evaluate lower abdominal pain and tenderness s/p multiple abdominal surgeries and hx bariatric surgery  Got contrast after colon today

## 2016-05-15 NOTE — Progress Notes (Signed)
Pt's states no medical or surgical changes since previsit or office visit. 

## 2016-05-15 NOTE — Telephone Encounter (Signed)
CT scan scheduled at [facility] 05/23/16 1:00.  She is notified of the details.

## 2016-05-15 NOTE — Progress Notes (Signed)
Report given to PACU, vss 

## 2016-05-15 NOTE — Progress Notes (Signed)
Verbal order received to give pt. Contrast, 2 bottles of ct contrast along with ct instructions sheet sent with pt. Per doctor instructions.

## 2016-05-15 NOTE — Telephone Encounter (Signed)
Patient has been rescheduled for [facility] location on 05/23/16 1:00.  She has been notified

## 2016-05-15 NOTE — Op Note (Signed)
[facility] Patient Name: Julia Larson Procedure Date: 05/15/2016 7:24 AM MRN: [id number] Endoscopist: Gatha Mayer , MD Age: 61 Referring MD:  Date of Birth: 1955/12/29 Gender: Female Account #: [id number] Procedure:                Colonoscopy Indications:              Colon cancer screening in patient at increased                            risk: Family history of colorectal cancer in                            multiple 2nd degree relatives Medicines:                Propofol per Anesthesia, Monitored Anesthesia Care Procedure:                Pre-Anesthesia Assessment:                           - Prior to the procedure, a History and Physical                            was performed, and patient medications and                            allergies were reviewed. The patient's tolerance of                            previous anesthesia was also reviewed. The risks                            and benefits of the procedure and the sedation                            options and risks were discussed with the patient.                            All questions were answered, and informed consent                            was obtained. Prior Anticoagulants: The patient has                            taken no previous anticoagulant or antiplatelet                            agents. ASA Grade Assessment: I - A normal, healthy                            patient. After reviewing the risks and benefits,                            the patient was deemed in satisfactory condition to  undergo the procedure.                           After obtaining informed consent, the colonoscope                            was passed under direct vision. Throughout the                            procedure, the patient's blood pressure, pulse, and                            oxygen saturations were monitored continuously. The                            Colonoscope was introduced  through the anus and                            advanced to the the cecum, identified by                            appendiceal orifice and ileocecal valve. The                            colonoscopy was performed without difficulty. The                            patient tolerated the procedure well. The quality                            of the bowel preparation was excellent. The bowel                            preparation used was Clen-Piq. The ileocecal valve,                            appendiceal orifice, and rectum were photographed. Scope In: 7:36:27 AM Scope Out: 7:51:47 AM Scope Withdrawal Time: 0 hours 12 minutes 51 seconds  Total Procedure Duration: 0 hours 15 minutes 20 seconds  Findings:                 The perianal and digital rectal examinations were                            normal.                           Three sessile polyps were found in the ascending                            colon. The polyps were 4 to 8 mm in size. These                            polyps were removed with a cold snare. Resection  and retrieval were complete. Verification of                            patient identification for the specimen was done.                            Estimated blood loss was minimal.                           The exam was otherwise without abnormality on                            direct and retroflexion views. Complications:            No immediate complications. Estimated Blood Loss:     Estimated blood loss was minimal. Impression:               - Three 4 to 8 mm polyps in the ascending colon,                            removed with a cold snare. Resected and retrieved.                           - The examination was otherwise normal on direct                            and retroflexion views. Recommendation:           - Patient has a contact number available for                            emergencies. The signs and symptoms of potential                             delayed complications were discussed with the                            patient. Return to normal activities tomorrow.                            Written discharge instructions were provided to the                            patient.                           - Resume previous diet.                           - Continue present medications.                           - Repeat colonoscopy is recommended for                            surveillance. The colonoscopy date will be  determined after pathology results from today's                            exam become available for review. Gatha Mayer, MD 05/15/2016 8:02:52 AM This report has been signed electronically.

## 2016-05-15 NOTE — Progress Notes (Signed)
Called to room to assist during endoscopic procedure.  Patient ID and intended procedure confirmed with present staff. Received instructions for my participation in the procedure from the performing physician.  

## 2016-05-15 NOTE — Patient Instructions (Addendum)
I found and removed 3 small polyps that look benign.  All else normal.  I will let you know pathology results and when to have another routine colonoscopy by mail and/or My Chart. Anticipate 3 years.  I appreciate the opportunity to care for you. Gatha Mayer, MD, FACG      YOU HAD AN ENDOSCOPIC PROCEDURE TODAY AT [facility]:   Refer to the procedure report that was given to you for any specific questions about what was found during the examination.  If the procedure report does not answer your questions, please call your gastroenterologist to clarify.  If you requested that your care partner not be given the details of your procedure findings, then the procedure report has been included in a sealed envelope for you to review at your convenience later.  YOU SHOULD EXPECT: Some feelings of bloating in the abdomen. Passage of more gas than usual.  Walking can help get rid of the air that was put into your GI tract during the procedure and reduce the bloating. If you had a lower endoscopy (such as a colonoscopy or flexible sigmoidoscopy) you may notice spotting of blood in your stool or on the toilet paper. If you underwent a bowel prep for your procedure, you may not have a normal bowel movement for a few days.  Please Note:  You might notice some irritation and congestion in your nose or some drainage.  This is from the oxygen used during your procedure.  There is no need for concern and it should clear up in a day or so.  SYMPTOMS TO REPORT IMMEDIATELY:   Following lower endoscopy (colonoscopy or flexible sigmoidoscopy):  Excessive amounts of blood in the stool  Significant tenderness or worsening of abdominal pains  Swelling of the abdomen that is new, acute  Fever of 100F or higher    For urgent or emergent issues, a gastroenterologist can be reached at any hour by calling [phone].   DIET:  We do recommend a small meal at first, but then you  may proceed to your regular diet.  Drink plenty of fluids but you should avoid alcoholic beverages for 24 hours.  ACTIVITY:  You should plan to take it easy for the rest of today and you should NOT DRIVE or use heavy machinery until tomorrow (because of the sedation medicines used during the test).    FOLLOW UP: Our staff will call the number listed on your records the next business day following your procedure to check on you and address any questions or concerns that you may have regarding the information given to you following your procedure. If we do not reach you, we will leave a message.  However, if you are feeling well and you are not experiencing any problems, there is no need to return our call.  We will assume that you have returned to your regular daily activities without incident.  If any biopsies were taken you will be contacted by phone or by letter within the next 1-3 weeks.  Please call us at [phone] if you have not heard about the biopsies in 3 weeks.    SIGNATURES/CONFIDENTIALITY: You and/or your care partner have signed paperwork which will be entered into your electronic medical record.  These signatures attest to the fact that that the information above on your After Visit Summary has been reviewed and is understood.  Full responsibility of the confidentiality of this discharge information lies with you and/or  your care-partner.   Resume medications. Information given on polyps.

## 2016-05-16 ENCOUNTER — Telehealth: Payer: Self-pay | Admitting: *Deleted

## 2016-05-16 NOTE — Telephone Encounter (Signed)
  Follow up Call-  Call back number 05/15/2016  Post procedure Call Back phone  # [phone]  Permission to leave phone message Yes     Patient questions:  Do you have a fever, pain , or abdominal swelling? No. Pain Score  0 *  Have you tolerated food without any problems? Yes.    Have you been able to return to your normal activities? Yes.    Do you have any questions about your discharge instructions: Diet   No. Medications  No. Follow up visit  No.  Do you have questions or concerns about your Care? No.  Actions: * If pain score is 4 or above: No action needed, pain <4.

## 2016-05-20 ENCOUNTER — Encounter: Payer: Self-pay | Admitting: Internal Medicine

## 2016-05-20 DIAGNOSIS — Z86010 Personal history of colonic polyps: Secondary | ICD-10-CM

## 2016-05-20 HISTORY — DX: Personal history of colonic polyps: Z86.010

## 2016-05-20 NOTE — Progress Notes (Signed)
3 small adenomas recall 2021 My Chart letter

## 2016-05-23 ENCOUNTER — Ambulatory Visit (INDEPENDENT_AMBULATORY_CARE_PROVIDER_SITE_OTHER): Payer: BC Managed Care – PPO

## 2016-05-23 ENCOUNTER — Other Ambulatory Visit: Payer: Self-pay | Admitting: Internal Medicine

## 2016-05-23 ENCOUNTER — Other Ambulatory Visit: Payer: BC Managed Care – PPO

## 2016-05-23 DIAGNOSIS — R1030 Lower abdominal pain, unspecified: Secondary | ICD-10-CM

## 2016-05-23 DIAGNOSIS — R10817 Generalized abdominal tenderness: Secondary | ICD-10-CM

## 2016-05-23 DIAGNOSIS — R935 Abnormal findings on diagnostic imaging of other abdominal regions, including retroperitoneum: Secondary | ICD-10-CM | POA: Diagnosis not present

## 2016-05-23 MED ORDER — IOPAMIDOL (ISOVUE-300) INJECTION 61%
100.0000 mL | Freq: Once | INTRAVENOUS | Status: AC | PRN
  Administered 2016-05-23: 100 mL via INTRAVENOUS

## 2016-05-23 NOTE — Progress Notes (Signed)
The CT is probably ok There is some haziness around pancreas suggesting possible pancreatitis - though sometimes an ulcer can do this if so it would be in the area we cannot get to with a scope  Ask her to check an amylase and lipase re: abdominal pain - generalized and abnormal CT pancreas

## 2016-05-24 ENCOUNTER — Telehealth: Payer: Self-pay | Admitting: Internal Medicine

## 2016-05-24 DIAGNOSIS — R933 Abnormal findings on diagnostic imaging of other parts of digestive tract: Secondary | ICD-10-CM

## 2016-05-24 DIAGNOSIS — R1084 Generalized abdominal pain: Secondary | ICD-10-CM

## 2016-05-24 NOTE — Telephone Encounter (Signed)
I left a message for the patient.  See results of CT for additional details.

## 2016-05-25 ENCOUNTER — Encounter: Payer: Self-pay | Admitting: Physician Assistant

## 2016-05-25 ENCOUNTER — Other Ambulatory Visit (INDEPENDENT_AMBULATORY_CARE_PROVIDER_SITE_OTHER): Payer: BC Managed Care – PPO

## 2016-05-25 DIAGNOSIS — R933 Abnormal findings on diagnostic imaging of other parts of digestive tract: Secondary | ICD-10-CM | POA: Diagnosis not present

## 2016-05-25 DIAGNOSIS — R1084 Generalized abdominal pain: Secondary | ICD-10-CM

## 2016-05-25 LAB — LIPASE: Lipase: 23 U/L (ref 11.0–59.0)

## 2016-05-25 LAB — AMYLASE: AMYLASE: 23 U/L — AB (ref 27–131)

## 2016-05-26 NOTE — Progress Notes (Signed)
My Chart note

## 2016-05-29 ENCOUNTER — Telehealth: Payer: Self-pay | Admitting: Internal Medicine

## 2016-05-29 NOTE — Telephone Encounter (Signed)
Left message for patient to call back  

## 2016-05-29 NOTE — Telephone Encounter (Signed)
Patient reports that she is having "central abdominal pain that works its way down to the lower abdomen".  Pain is crampy or sharp.  Pain is present 2-3 times a week. She has not been able to relate the symptoms to activity, diet, or position. She feels that these symptoms started after being started on vitamins by her primary care.   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

## 2016-05-29 NOTE — Telephone Encounter (Signed)
Patient is still having abdominal pain.  She is asking what the next steps would be.

## 2016-05-29 NOTE — Telephone Encounter (Signed)
I was working up lower abdominal pain  CT scan may just be red herring but some inflammation possible around pancreas - sometimes an ulcer could cause this.  An EGD could be needed to help sort out but the bypass she had may limit our ability to see  I thought that she had had a laparoscopic roux-en y bypass but would like confirmation and also update on location and character of her abdominal pain and will recommend

## 2016-05-29 NOTE — Telephone Encounter (Signed)
OK  Try dicyclomine 20 mg q 6 prn cramps # 60 1 RF  Office visit next available

## 2016-05-30 MED ORDER — DICYCLOMINE HCL 20 MG PO TABS: 20.0000 mg | ORAL_TABLET | Freq: Four times a day (QID) | ORAL | 1 refills | Status: DC

## 2016-05-30 NOTE — Telephone Encounter (Signed)
Patient notified rx sent Follow up arranged

## 2016-07-17 ENCOUNTER — Ambulatory Visit: Payer: BC Managed Care – PPO | Admitting: Internal Medicine

## 2017-02-19 ENCOUNTER — Telehealth: Payer: Self-pay | Admitting: Physician Assistant

## 2017-02-19 NOTE — Telephone Encounter (Addendum)
Pt called and would like an order put in for a mammogram. She has a cpe on 2/19 with Jade. Also, She wanted to see what type of screening can be done to check her chances on getting adnocarcinoma. She stated her sister just past away from it and the doctor of her sister stated since Harrison has had bypass surgery she has a higher risk of getting it. Thanks

## 2017-02-20 ENCOUNTER — Other Ambulatory Visit: Payer: Self-pay | Admitting: Physician Assistant

## 2017-02-20 DIAGNOSIS — Z1231 Encounter for screening mammogram for malignant neoplasm of breast: Secondary | ICD-10-CM

## 2017-02-20 NOTE — Telephone Encounter (Signed)
We can discuss at CPE. adenocarcinoma is a cancer of the glands and glands are located many places in the body. Mammogram ordered.

## 2017-02-27 ENCOUNTER — Ambulatory Visit (INDEPENDENT_AMBULATORY_CARE_PROVIDER_SITE_OTHER): Payer: BC Managed Care – PPO | Admitting: Physician Assistant

## 2017-02-27 ENCOUNTER — Telehealth: Payer: Self-pay | Admitting: Physician Assistant

## 2017-02-27 ENCOUNTER — Encounter: Payer: Self-pay | Admitting: Physician Assistant

## 2017-02-27 ENCOUNTER — Ambulatory Visit (INDEPENDENT_AMBULATORY_CARE_PROVIDER_SITE_OTHER): Payer: BC Managed Care – PPO

## 2017-02-27 VITALS — BP 114/66 | HR 67 | Ht 64.0 in | Wt 193.0 lb

## 2017-02-27 DIAGNOSIS — K2100 Gastro-esophageal reflux disease with esophagitis, without bleeding: Secondary | ICD-10-CM

## 2017-02-27 DIAGNOSIS — Z131 Encounter for screening for diabetes mellitus: Secondary | ICD-10-CM | POA: Diagnosis not present

## 2017-02-27 DIAGNOSIS — F5104 Psychophysiologic insomnia: Secondary | ICD-10-CM

## 2017-02-27 DIAGNOSIS — E6609 Other obesity due to excess calories: Secondary | ICD-10-CM

## 2017-02-27 DIAGNOSIS — R05 Cough: Secondary | ICD-10-CM

## 2017-02-27 DIAGNOSIS — Z23 Encounter for immunization: Secondary | ICD-10-CM | POA: Diagnosis not present

## 2017-02-27 DIAGNOSIS — F4320 Adjustment disorder, unspecified: Secondary | ICD-10-CM

## 2017-02-27 DIAGNOSIS — Z1159 Encounter for screening for other viral diseases: Secondary | ICD-10-CM | POA: Diagnosis not present

## 2017-02-27 DIAGNOSIS — D1721 Benign lipomatous neoplasm of skin and subcutaneous tissue of right arm: Secondary | ICD-10-CM

## 2017-02-27 DIAGNOSIS — Z800 Family history of malignant neoplasm of digestive organs: Secondary | ICD-10-CM | POA: Diagnosis not present

## 2017-02-27 DIAGNOSIS — F430 Acute stress reaction: Secondary | ICD-10-CM | POA: Diagnosis not present

## 2017-02-27 DIAGNOSIS — Z13220 Encounter for screening for lipoid disorders: Secondary | ICD-10-CM | POA: Diagnosis not present

## 2017-02-27 DIAGNOSIS — R059 Cough, unspecified: Secondary | ICD-10-CM

## 2017-02-27 DIAGNOSIS — Z801 Family history of malignant neoplasm of trachea, bronchus and lung: Secondary | ICD-10-CM | POA: Diagnosis not present

## 2017-02-27 DIAGNOSIS — F4321 Adjustment disorder with depressed mood: Secondary | ICD-10-CM

## 2017-02-27 DIAGNOSIS — Z6833 Body mass index (BMI) 33.0-33.9, adult: Secondary | ICD-10-CM

## 2017-02-27 DIAGNOSIS — Z0000 Encounter for general adult medical examination without abnormal findings: Secondary | ICD-10-CM | POA: Diagnosis not present

## 2017-02-27 MED ORDER — LORCASERIN HCL ER 20 MG PO TB24: 1.0000 | ORAL_TABLET | Freq: Every day | ORAL | 2 refills | Status: DC

## 2017-02-27 MED ORDER — TRAZODONE HCL 50 MG PO TABS: 25.0000 mg | ORAL_TABLET | Freq: Every evening | ORAL | 3 refills | Status: DC | PRN

## 2017-02-27 NOTE — Patient Instructions (Signed)

## 2017-02-27 NOTE — Progress Notes (Signed)
Call pt: hyperinflated lungs suggestive of COPD. No masses. Overall good. If becomes SOB need to assess for treatment of COPD.

## 2017-02-27 NOTE — Progress Notes (Signed)
Subjective:    Patient ID: Julia Larson, female    DOB: 01-13-1955, 62 y.o.   MRN: [id number]  HPI Pt is a 62 yo obese female  She is most concerned about her sister recent dx and death from stomach cancer. She is very concerned about her own cancer risk. She is having some worsening GERD symptoms and epigastric pain. She just had colonoscopy with 5 year follow up.   She has also been really stressed. She finds herself emotional. She can't sleep well. Taking benadryl OTC. No SI or HC.   She really wants to lose weight.   Lipoma- Dr. Darene Lamer injected with steroid. She would like to have removed it is becoming tender and painful.   Pt has smoked in the past but stopped many years ago. She did not smoke many cigarettes a day. She does have a dry cough intermittently. No SOB or chest tightness.   .. Active Ambulatory Problems    Diagnosis Date Noted  . Shingles outbreak 01/21/2014  . H/O gastric bypass 01/21/2014  . History of diabetes mellitus 01/21/2014  . De Quervain's tenosynovitis, right 01/21/2014  . Right wrist pain 01/26/2014  . Right leg pain 01/26/2014  . Primary osteoarthritis of right hip 01/26/2014  . Primary osteoarthritis of both knees 02/17/2014  . Lipoma of shoulder 02/17/2014  . Nocturnal muscle cramps 05/28/2014  . Lumbar radiculopathy 09/21/2014  . RLS (restless legs syndrome) 09/21/2014  . Fatigue 09/21/2014  . Solar lentigo 10/16/2014  . Actinic keratosis due to exposure to sunlight 10/16/2014  . Low iron stores 10/16/2014  . Breast pain, left 10/22/2014  . Obesity 08/04/2015  . Depression 08/04/2015  . Hx of adenomatous colonic polyps 05/20/2016  . Family history of stomach cancer 03/01/2017  . Family history of colon cancer 03/01/2017  . Cough 03/01/2017  . Class 1 obesity due to excess calories without serious comorbidity with body mass index (BMI) of 33.0 to 33.9 in adult 03/01/2017  . Primary insomnia 03/01/2017  . Acute stress reaction 03/01/2017  .  Grief reaction 03/01/2017  . Lipoma of right shoulder 03/01/2017  . Gastroesophageal reflux disease with esophagitis 03/01/2017   Resolved Ambulatory Problems    Diagnosis Date Noted  . Mass of skin of right shoulder 01/21/2014   Past Medical History:  Diagnosis Date  . Depression   . Hx of adenomatous colonic polyps 05/20/2016  . Influenza   . Lipoma of shoulder   . OA (osteoarthritis)   . Obesity    .Marland Kitchen Family History  Problem Relation Age of Onset  . Lung cancer Mother   . Cancer Mother        lung cancer  . Cancer Father        lung  . Alcohol abuse Sister   . Cancer Sister 2       stomach cancer  . Alcohol abuse Brother   . Hypertension Unknown   . Diabetes Unknown   . Colon cancer Maternal Aunt   . Cancer Maternal Aunt        colon cancer  . Colon cancer Maternal Aunt   . Cancer Maternal Aunt        colon cancer  . Colon cancer Other        Maternal grandmother and 3 great maternal aunt's  . Colon cancer Cousin   . Cancer Paternal Aunt        colon cancer  . Neuropathy Neg Hx    .Marland Kitchen Social History   Socioeconomic  History  . Marital status: Single    Spouse name: Not on file  . Number of children: 2  . Years of education: 12+  . Highest education level: Not on file  Social Needs  . Financial resource strain: Not on file  . Food insecurity - worry: Not on file  . Food insecurity - inability: Not on file  . Transportation needs - medical: Not on file  . Transportation needs - non-medical: Not on file  Occupational History  . Occupation: [employer]  Tobacco Use  . Smoking status: Former Smoker    Last attempt to quit: 11/09/1985    Years since quitting: 31.3  . Smokeless tobacco: Never Used  Substance and Sexual Activity  . Alcohol use: Yes    Alcohol/week: 0.0 oz    Comment: 1-2 glasses per week (09/21/14)  . Drug use: No  . Sexual activity: Not Currently  Other Topics Concern  . Not on file  Social History Narrative   She is  divorced     is employed in Data processing manager support   Lives at home with brother and sister-in-law   Caffeine use: 1-2 cups per day         Review of Systems  All other systems reviewed and are negative.      Objective:   Physical Exam  Constitutional: She is oriented to person, place, and time. She appears well-developed and well-nourished.  HENT:  Head: Normocephalic and atraumatic.  Right Ear: External ear normal.  Left Ear: External ear normal.  Nose: Nose normal.  Mouth/Throat: Oropharynx is clear and moist. No oropharyngeal exudate.  Eyes: Conjunctivae and EOM are normal. Pupils are equal, round, and reactive to light.  Neck: Normal range of motion. Neck supple. No thyromegaly present.  Cardiovascular: Normal rate, regular rhythm and normal heart sounds.  No murmur heard. Pulmonary/Chest: Effort normal and breath sounds normal. She has no wheezes.  Left breast inverted nipple.(per patient always been that way)  Abdominal: Soft. Bowel sounds are normal. She exhibits no distension and no mass. There is no tenderness. There is no rebound and no guarding.  Musculoskeletal: Normal range of motion. She exhibits no tenderness or deformity.  Upper and lower extermity strength 5/5.  Mobile firm slightly painful nodule of rigth posterior shoulder. Approximately 3cm by 3cm.   Lymphadenopathy:    She has no cervical adenopathy.  Neurological: She is alert and oriented to person, place, and time. She has normal reflexes. No cranial nerve deficit. Coordination normal.  Skin: No rash noted.  Psychiatric: She has a normal mood and affect. Her behavior is normal.          Assessment & Plan:  Marland KitchenMarland KitchenDiagnoses and all orders for this visit:  Routine physical examination -     Hepatitis C Antibody -     Lipid Panel w/reflex Direct LDL -     COMPLETE METABOLIC PANEL WITH GFR -     TSH -     CBC with Differential/Platelet -     CA 125  Influenza vaccine needed -     Flu Vaccine  QUAD 6+ mos PF IM (Fluarix Quad PF)  Need for Tdap vaccination -     Tdap vaccine greater than or equal to 7yo IM  Need for hepatitis C screening test -     Hepatitis C Antibody  Screening for lipid disorders -     Lipid Panel w/reflex Direct LDL  Screening for diabetes mellitus -  COMPLETE METABOLIC PANEL WITH GFR  Cough -     DG Chest 2 View  Family history of stomach cancer -     Ambulatory referral to Gastroenterology  Family history of colon cancer  Psychophysiological insomnia -     traZODone (DESYREL) 50 MG tablet; Take 0.5-1 tablets (25-50 mg total) by mouth at bedtime as needed for sleep.  Class 1 obesity due to excess calories without serious comorbidity with body mass index (BMI) of 33.0 to 33.9 in adult  Acute stress reaction  Grief reaction  Gastroesophageal reflux disease with esophagitis -     Ambulatory referral to Gastroenterology  Lipoma of right shoulder -     Ambulatory referral to General Surgery  Other orders -     Lorcaserin HCl ER (BELVIQ XR) 20 MG TB24; Take 1 tablet by mouth daily.  .. Depression screen PHQ 2/9 02/27/2017  Decreased Interest 2  Down, Depressed, Hopeless 2  PHQ - 2 Score 4  Altered sleeping 3  Tired, decreased energy 2  Change in appetite 2  Feeling bad or failure about yourself  0  Trouble concentrating 1  Moving slowly or fidgety/restless 0  Suicidal thoughts 0  PHQ-9 Score 12  Difficult doing work/chores Somewhat difficult    Pt is not a candidate for pap.  Mammogram ordered and scheduled.  Colonoscopy up to date.  Fasting labs ordered.   Referral made to GI for endoscopy. Discussed starting back omeprazole.   .. Discussed 150 minutes of exercise a week.  Encouraged vitamin D 1000 units and Calcium 1300mg  or 4 servings of dairy a day.   Marland Kitchen.Discussed low carb diet with 1500 calories and 80g of protein.  Exercising at least 150 minutes a week.  My Fitness Pal could be a Microbiologist.  Discussed  medication options.  Will try belviq. Discussed side effects. Follow up in 6 weeks.   Pt is very concerned about her overall cancer risk. Discussed importance of staying up to date on all the screenings indicated. She is here today to get up to date. I will email GI to see his thoughts on endoscopy due to family hx of stomach cancer. She does not meet qualifications for lung cancer screening. CA 125 was ordered to look for any sign of cancer burden.  CXR to screen for cough and lung cancer.   Discussed mood/stress/grief. Strongly urged patient to consider counseling. Pt declined any medications. Did give her trazodone to try for sleep.   Pt has had lipoma injected and now she wants removed. Referral to gen surgery made.

## 2017-02-27 NOTE — Telephone Encounter (Signed)
Received fax from [facility] that [facility] has been approved from 02/27/2017 until 05/27/2017. Pharmacy notified. Forms sent to scan.   Reference number: [id number]

## 2017-02-28 NOTE — Progress Notes (Signed)
Call pt: cholesterol looks great. Kdiney, liver looks good. Thyroid great.  Glucose elevated. Please add a1c.  Not anemic.  CA125 very low.

## 2017-03-01 ENCOUNTER — Encounter: Payer: Self-pay | Admitting: Physician Assistant

## 2017-03-01 DIAGNOSIS — D1721 Benign lipomatous neoplasm of skin and subcutaneous tissue of right arm: Secondary | ICD-10-CM | POA: Insufficient documentation

## 2017-03-01 DIAGNOSIS — F4321 Adjustment disorder with depressed mood: Secondary | ICD-10-CM | POA: Insufficient documentation

## 2017-03-01 DIAGNOSIS — Z800 Family history of malignant neoplasm of digestive organs: Secondary | ICD-10-CM | POA: Insufficient documentation

## 2017-03-01 DIAGNOSIS — F419 Anxiety disorder, unspecified: Secondary | ICD-10-CM | POA: Insufficient documentation

## 2017-03-01 DIAGNOSIS — K2100 Gastro-esophageal reflux disease with esophagitis, without bleeding: Secondary | ICD-10-CM | POA: Insufficient documentation

## 2017-03-01 DIAGNOSIS — R05 Cough: Secondary | ICD-10-CM | POA: Insufficient documentation

## 2017-03-01 DIAGNOSIS — R059 Cough, unspecified: Secondary | ICD-10-CM | POA: Insufficient documentation

## 2017-03-01 DIAGNOSIS — E6609 Other obesity due to excess calories: Secondary | ICD-10-CM | POA: Insufficient documentation

## 2017-03-01 DIAGNOSIS — F5101 Primary insomnia: Secondary | ICD-10-CM | POA: Insufficient documentation

## 2017-03-01 DIAGNOSIS — Z6833 Body mass index (BMI) 33.0-33.9, adult: Secondary | ICD-10-CM

## 2017-03-03 ENCOUNTER — Other Ambulatory Visit: Payer: Self-pay | Admitting: Physician Assistant

## 2017-03-06 LAB — LIPID PANEL W/REFLEX DIRECT LDL
Cholesterol: 188 mg/dL (ref <200)
HDL: 82 mg/dL (ref >50)
LDL Cholesterol (Calc): 84 mg/dL (calc)
Non-HDL Cholesterol (Calc): 106 mg/dL (calc) (ref <130)
TRIGLYCERIDES: 120 mg/dL (ref <150)
Total CHOL/HDL Ratio: 2.3 (calc) (ref <5.0)

## 2017-03-06 LAB — COMPLETE METABOLIC PANEL WITH GFR
AG Ratio: 1.7 (calc) (ref 1.0–2.5)
ALBUMIN MSPROF: 3.9 g/dL (ref 3.6–5.1)
ALT: 18 U/L (ref 6–29)
AST: 23 U/L (ref 10–35)
Alkaline phosphatase (APISO): 98 U/L (ref 33–130)
BUN: 15 mg/dL (ref 7–25)
CO2: 30 mmol/L (ref 20–32)
CREATININE: 0.83 mg/dL (ref 0.50–0.99)
Calcium: 9 mg/dL (ref 8.6–10.4)
Chloride: 107 mmol/L (ref 98–110)
GFR, EST AFRICAN AMERICAN: 89 mL/min/{1.73_m2} (ref >=60)
GFR, Est Non African American: 77 mL/min/{1.73_m2} (ref >=60)
GLUCOSE: 112 mg/dL — AB (ref 65–99)
Globulin: 2.3 g/dL (calc) (ref 1.9–3.7)
Potassium: 4.3 mmol/L (ref 3.5–5.3)
Sodium: 141 mmol/L (ref 135–146)
Total Bilirubin: 0.4 mg/dL (ref 0.2–1.2)
Total Protein: 6.2 g/dL (ref 6.1–8.1)

## 2017-03-06 LAB — CBC WITH DIFFERENTIAL/PLATELET
BASOS ABS: 31 {cells}/uL (ref 0–200)
Basophils Relative: 0.7 %
EOS ABS: 79 {cells}/uL (ref 15–500)
Eosinophils Relative: 1.8 %
HCT: 44.5 % (ref 35.0–45.0)
HEMOGLOBIN: 14.5 g/dL (ref 11.7–15.5)
LYMPHS ABS: 1122 {cells}/uL (ref 850–3900)
MCH: 27.3 pg (ref 27.0–33.0)
MCHC: 32.6 g/dL (ref 32.0–36.0)
MCV: 83.6 fL (ref 80.0–100.0)
MPV: 10.9 fL (ref 7.5–12.5)
Monocytes Relative: 7.2 %
NEUTROS ABS: 2851 {cells}/uL (ref 1500–7800)
Neutrophils Relative %: 64.8 %
Platelets: 188 10*3/uL (ref 140–400)
RBC: 5.32 10*6/uL — ABNORMAL HIGH (ref 3.80–5.10)
RDW: 13.6 % (ref 11.0–15.0)
Total Lymphocyte: 25.5 %
WBC mixed population: 317 cells/uL (ref 200–950)
WBC: 4.4 10*3/uL (ref 3.8–10.8)

## 2017-03-06 LAB — HEMOGLOBIN A1C W/OUT EAG: Hgb A1c MFr Bld: 5.7 % of total Hgb — ABNORMAL HIGH (ref <5.7)

## 2017-03-06 LAB — CA 125: CA 125: 5 U/mL (ref <35)

## 2017-03-06 LAB — TSH: TSH: 1.32 mIU/L (ref 0.40–4.50)

## 2017-03-06 NOTE — Progress Notes (Signed)
Call pt: a1c is 5.7. Pre-diabetes range but improved a bit from 3 years ago. Managing with diet.

## 2017-03-14 ENCOUNTER — Telehealth: Payer: Self-pay | Admitting: *Deleted

## 2017-03-14 NOTE — Telephone Encounter (Signed)
Opened in error

## 2017-03-28 ENCOUNTER — Ambulatory Visit (INDEPENDENT_AMBULATORY_CARE_PROVIDER_SITE_OTHER): Payer: BC Managed Care – PPO

## 2017-03-28 DIAGNOSIS — Z1231 Encounter for screening mammogram for malignant neoplasm of breast: Secondary | ICD-10-CM | POA: Diagnosis not present

## 2017-03-28 NOTE — Progress Notes (Signed)
Call pt: normal mammogram follow up in 1 year.

## 2017-03-29 ENCOUNTER — Other Ambulatory Visit: Payer: Self-pay | Admitting: *Deleted

## 2017-03-29 MED ORDER — OMEPRAZOLE 40 MG PO CPDR: 40.0000 mg | DELAYED_RELEASE_CAPSULE | Freq: Every day | ORAL | 3 refills | Status: DC

## 2017-04-04 ENCOUNTER — Encounter: Payer: Self-pay | Admitting: Physician Assistant

## 2017-04-04 ENCOUNTER — Ambulatory Visit: Payer: BC Managed Care – PPO | Admitting: Physician Assistant

## 2017-04-04 VITALS — BP 126/53 | HR 60 | Temp 98.0°F | Ht 64.0 in | Wt 194.0 lb

## 2017-04-04 DIAGNOSIS — Z6833 Body mass index (BMI) 33.0-33.9, adult: Secondary | ICD-10-CM

## 2017-04-04 DIAGNOSIS — R300 Dysuria: Secondary | ICD-10-CM

## 2017-04-04 DIAGNOSIS — Z9884 Bariatric surgery status: Secondary | ICD-10-CM | POA: Diagnosis not present

## 2017-04-04 DIAGNOSIS — Z566 Other physical and mental strain related to work: Secondary | ICD-10-CM

## 2017-04-04 DIAGNOSIS — E6609 Other obesity due to excess calories: Secondary | ICD-10-CM

## 2017-04-04 DIAGNOSIS — N3001 Acute cystitis with hematuria: Secondary | ICD-10-CM

## 2017-04-04 LAB — POCT URINALYSIS DIPSTICK
BILIRUBIN UA: NEGATIVE
Blood, UA: NEGATIVE
Glucose, UA: NEGATIVE
KETONES UA: NEGATIVE
NITRITE UA: POSITIVE
PH UA: 6.5 (ref 5.0–8.0)
Protein, UA: NEGATIVE
SPEC GRAV UA: 1.01 (ref 1.010–1.025)
UROBILINOGEN UA: 0.2 U/dL

## 2017-04-04 MED ORDER — NITROFURANTOIN MONOHYD MACRO 100 MG PO CAPS: 100.0000 mg | ORAL_CAPSULE | Freq: Two times a day (BID) | ORAL | 0 refills | Status: DC

## 2017-04-04 NOTE — Progress Notes (Signed)
Subjective:    Patient ID: Julia Larson, female    DOB: 10/23/1955, 62 y.o.   MRN: [id number]  HPI  Pt is a 62 yo female who presents to the clinic with 2 days of dysuria and suprapubic pressure. No fever, chills, flank pain, nausea, vomiting. Took azo once with little relief.   She is taking belviq for weight loss. H/O gastric bypass over 10 years ago. She has gained 1lbs since being on belviq for 6 weeks. She just does not feel like it is controlling her hunger. She tries to eat healthy snacks of almonds and yogurt. She does admit to evening snack with crackers and cheese. She is eating more veggies and less carbs. She does admit to stress eating. Stress at work has increased a lot.   .. Active Ambulatory Problems    Diagnosis Date Noted  . Shingles outbreak 01/21/2014  . H/O gastric bypass 01/21/2014  . History of diabetes mellitus 01/21/2014  . De Quervain's tenosynovitis, right 01/21/2014  . Right wrist pain 01/26/2014  . Right leg pain 01/26/2014  . Primary osteoarthritis of right hip 01/26/2014  . Primary osteoarthritis of both knees 02/17/2014  . Lipoma of shoulder 02/17/2014  . Nocturnal muscle cramps 05/28/2014  . Lumbar radiculopathy 09/21/2014  . RLS (restless legs syndrome) 09/21/2014  . Fatigue 09/21/2014  . Solar lentigo 10/16/2014  . Actinic keratosis due to exposure to sunlight 10/16/2014  . Low iron stores 10/16/2014  . Breast pain, left 10/22/2014  . Obesity 08/04/2015  . Depression 08/04/2015  . Hx of adenomatous colonic polyps 05/20/2016  . Family history of stomach cancer 03/01/2017  . Family history of colon cancer 03/01/2017  . Cough 03/01/2017  . Class 1 obesity due to excess calories without serious comorbidity with body mass index (BMI) of 33.0 to 33.9 in adult 03/01/2017  . Primary insomnia 03/01/2017  . Acute stress reaction 03/01/2017  . Grief reaction 03/01/2017  . Lipoma of right shoulder 03/01/2017  . Gastroesophageal reflux disease with  esophagitis 03/01/2017  . Stress at work 04/04/2017   Resolved Ambulatory Problems    Diagnosis Date Noted  . Mass of skin of right shoulder 01/21/2014   Past Medical History:  Diagnosis Date  . Depression   . Hx of adenomatous colonic polyps 05/20/2016  . Influenza   . Inverted nipple   . Lipoma of shoulder   . OA (osteoarthritis)   . Obesity       Review of Systems See HPI.     Objective:   Physical Exam  Constitutional: She is oriented to person, place, and time. She appears well-developed and well-nourished.  Obese.   HENT:  Head: Normocephalic and atraumatic.  Cardiovascular: Normal rate, regular rhythm and normal heart sounds.  Pulmonary/Chest: Effort normal and breath sounds normal. She has no wheezes.  No CVA tenderness.   Neurological: She is alert and oriented to person, place, and time.  Psychiatric: She has a normal mood and affect. Her behavior is normal.          Assessment & Plan:  Marland KitchenMarland KitchenTammy was seen today for dysuria and abdominal pain.  Diagnoses and all orders for this visit:  Acute cystitis with hematuria -     nitrofurantoin, macrocrystal-monohydrate, (MACROBID) 100 MG capsule; Take 1 capsule (100 mg total) by mouth 2 (two) times daily.  Dysuria -     POCT urinalysis dipstick -     Urine Culture  Class 1 obesity due to excess calories without serious comorbidity with  body mass index (BMI) of 33.0 to 33.9 in adult  H/O gastric bypass  Stress at work   .Marland Kitchen Results for orders placed or performed in visit on 04/04/17  POCT urinalysis dipstick  Result Value Ref Range   Color, UA orange    Clarity, UA clear    Glucose, UA neg    Bilirubin, UA neg    Ketones, UA neg    Spec Grav, UA 1.010 1.010 - 1.025   Blood, UA neg    pH, UA 6.5 5.0 - 8.0   Protein, UA neg    Urobilinogen, UA 0.2 0.2 or 1.0 E.U./dL   Nitrite, UA pos    Leukocytes, UA Trace (A) Negative   Appearance     Odor     Treated with macrobid for 7 days. HO given.  Discussed symptomatic care.  Follow up as needed.   Continue to take belviq. Give it 3 months. If no improvement consider switching to saxenda. Pt declined referral to bariatric clinic at this time. Marland Kitchen.Discussed low carb diet with 1500 calories and 80g of protein.  Exercising at least 150 minutes a week.  My Fitness Pal could be a Microbiologist.   Discussed stress relief at work. Encouraged exercise and deep breathing. Follow up if not imiproving.

## 2017-04-04 NOTE — Progress Notes (Signed)
   Subjective:    Patient ID: Julia Larson, female    DOB: [birth year], 62 y.o.   MRN: [id number]  HPI Julia Larson is a 62 year old female who presents today with concerns about a possible UTI. She states that she started to notice symptoms last night including pressure, suprapubic discomfort, and dysuria. She denies any flank pain, fevers, chills. She tried taking OTC Azo with no relief. She does mention that she is taking belviq as prescribed but has actually noticed a 2 pound weight gain. She is wanting to continue on this medication for the 3 month duration.  .. Active Ambulatory Problems    Diagnosis Date Noted  . Shingles outbreak 01/21/2014  . H/O gastric bypass 01/21/2014  . History of diabetes mellitus 01/21/2014  . De Quervain's tenosynovitis, right 01/21/2014  . Right wrist pain 01/26/2014  . Right leg pain 01/26/2014  . Primary osteoarthritis of right hip 01/26/2014  . Primary osteoarthritis of both knees 02/17/2014  . Lipoma of shoulder 02/17/2014  . Nocturnal muscle cramps 05/28/2014  . Lumbar radiculopathy 09/21/2014  . RLS (restless legs syndrome) 09/21/2014  . Fatigue 09/21/2014  . Solar lentigo 10/16/2014  . Actinic keratosis due to exposure to sunlight 10/16/2014  . Low iron stores 10/16/2014  . Breast pain, left 10/22/2014  . Obesity 08/04/2015  . Depression 08/04/2015  . Hx of adenomatous colonic polyps 05/20/2016  . Family history of stomach cancer 03/01/2017  . Family history of colon cancer 03/01/2017  . Cough 03/01/2017  . Class 1 obesity due to excess calories without serious comorbidity with body mass index (BMI) of 33.0 to 33.9 in adult 03/01/2017  . Primary insomnia 03/01/2017  . Acute stress reaction 03/01/2017  . Grief reaction 03/01/2017  . Lipoma of right shoulder 03/01/2017  . Gastroesophageal reflux disease with esophagitis 03/01/2017  . Stress at work 04/04/2017   Resolved Ambulatory Problems    Diagnosis Date Noted  . Mass of skin of right  shoulder 01/21/2014   Past Medical History:  Diagnosis Date  . Depression   . Hx of adenomatous colonic polyps 05/20/2016  . Influenza   . Inverted nipple   . Lipoma of shoulder   . OA (osteoarthritis)   . Obesity      Review of Systems  Constitutional: Negative for chills and fever.  Gastrointestinal: Positive for abdominal pain.  Genitourinary: Positive for dysuria. Negative for flank pain.  Musculoskeletal: Negative for back pain.       Objective:   Physical Exam  Constitutional: She is oriented to person, place, and time. She appears well-developed and well-nourished. No distress.  HENT:  Head: Normocephalic and atraumatic.  Eyes: Conjunctivae are normal.  Cardiovascular: Normal rate, regular rhythm and normal heart sounds.  Pulmonary/Chest: Effort normal and breath sounds normal.  Abdominal: There is tenderness (mild) in the suprapubic area. There is no CVA tenderness.  Neurological: She is alert and oriented to person, place, and time.  Skin: Skin is warm and dry.  Psychiatric: She has a normal mood and affect. Her behavior is normal.  Nursing note and vitals reviewed.         Assessment & Plan:

## 2017-04-04 NOTE — Patient Instructions (Signed)
saxenda for weight loss to consider.     Urinary Tract Infection, Adult A urinary tract infection (UTI) is an infection of any part of the urinary tract. The urinary tract includes the:  Kidneys.  Ureters.  Bladder.  Urethra.  These organs make, store, and get rid of pee (urine) in the body. Follow these instructions at home:  Take over-the-counter and prescription medicines only as told by your doctor.  If you were prescribed an antibiotic medicine, take it as told by your doctor. Do not stop taking the antibiotic even if you start to feel better.  Avoid the following drinks: ? Alcohol. ? Caffeine. ? Tea. ? Carbonated drinks.  Drink enough fluid to keep your pee clear or pale yellow.  Keep all follow-up visits as told by your doctor. This is important.  Make sure to: ? Empty your bladder often and completely. Do not to hold pee for long periods of time. ? Empty your bladder before and after sex. ? Wipe from front to back after a bowel movement if you are female. Use each tissue one time when you wipe. Contact a doctor if:  You have back pain.  You have a fever.  You feel sick to your stomach (nauseous).  You throw up (vomit).  Your symptoms do not get better after 3 days.  Your symptoms go away and then come back. Get help right away if:  You have very bad back pain.  You have very bad lower belly (abdominal) pain.  You are throwing up and cannot keep down any medicines or water. This information is not intended to replace advice given to you by your health care provider. Make sure you discuss any questions you have with your health care provider. Document Released: 06/14/2007 Document Revised: 06/03/2015 Document Reviewed: 11/16/2014 Elsevier Interactive Patient Education  [employer].

## 2017-04-06 LAB — URINE CULTURE
MICRO NUMBER:: 90383070
SPECIMEN QUALITY:: ADEQUATE

## 2017-04-06 NOTE — Progress Notes (Signed)
We can switch to cipro if she wants but macrobid should treat. If ok cipro 500mg  one tablet twice a day for 3 days.

## 2017-04-06 NOTE — Progress Notes (Signed)
Call pt: e.coli confirmed. Macrobid should treat well. Let us know if not improving.

## 2017-04-20 ENCOUNTER — Ambulatory Visit: Payer: BC Managed Care – PPO | Admitting: Internal Medicine

## 2017-05-15 ENCOUNTER — Telehealth: Payer: Self-pay | Admitting: Physician Assistant

## 2017-05-15 NOTE — Telephone Encounter (Signed)
Received fax from [facility] that Belviq was approved from 05/15/2017 through 05/16/2018. Pharmacy notified and forms sent to scan.   Reference ID: [id number].

## 2017-05-28 ENCOUNTER — Encounter: Payer: Self-pay | Admitting: Physician Assistant

## 2017-05-28 ENCOUNTER — Ambulatory Visit: Payer: BC Managed Care – PPO | Admitting: Physician Assistant

## 2017-05-28 VITALS — BP 114/48 | HR 65 | Ht 64.0 in | Wt 192.0 lb

## 2017-05-28 DIAGNOSIS — L987 Excessive and redundant skin and subcutaneous tissue: Secondary | ICD-10-CM | POA: Diagnosis not present

## 2017-05-28 DIAGNOSIS — Z6833 Body mass index (BMI) 33.0-33.9, adult: Secondary | ICD-10-CM

## 2017-05-28 DIAGNOSIS — E6609 Other obesity due to excess calories: Secondary | ICD-10-CM | POA: Diagnosis not present

## 2017-05-28 NOTE — Patient Instructions (Signed)
Slimroast coffee.

## 2017-05-28 NOTE — Progress Notes (Signed)
Subjective:    Patient ID: Julia Larson, female    DOB: 05/10/1955, 62 y.o.   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 She has been taking Belviq now for 3 months and has only lost 2 lbs. She is frustrated with the lack of weight loss and does not want to continue with the medication. She states she has trying to eat healthier, eating 6 small meals a day and eating lean protein before she eats the rest of her meals. She has tried using the My Fitness Pal app to log her meals, and she has also tried weight loss meal replacement shakes before but do not feel like they fill her up enough.  She also complains about the weight and extra skin that she still carries around her knees and hips. She says that it causes her pain when she walks. She is not as concerned with the cosmetics, she just wishes that she would not be in as much pain when walking.   .. Active Ambulatory Problems    Diagnosis Date Noted  . Shingles outbreak 01/21/2014  . H/O gastric bypass 01/21/2014  . History of diabetes mellitus 01/21/2014  . De Quervain's tenosynovitis, right 01/21/2014  . Right wrist pain 01/26/2014  . Right leg pain 01/26/2014  . Primary osteoarthritis of right hip 01/26/2014  . Primary osteoarthritis of both knees 02/17/2014  . Lipoma of shoulder 02/17/2014  . Nocturnal muscle cramps 05/28/2014  . Lumbar radiculopathy 09/21/2014  . RLS (restless legs syndrome) 09/21/2014  . Fatigue 09/21/2014  . Solar lentigo 10/16/2014  . Actinic keratosis due to exposure to sunlight 10/16/2014  . Low iron stores 10/16/2014  . Breast pain, left 10/22/2014  . Obesity 08/04/2015  . Depression 08/04/2015  . Hx of adenomatous colonic polyps 05/20/2016  . Family history of stomach cancer 03/01/2017  . Family history of colon cancer 03/01/2017  . Cough 03/01/2017  . Class 1 obesity due to excess calories without serious comorbidity with  body mass index (BMI) of 33.0 to 33.9 in adult 03/01/2017  . Primary insomnia 03/01/2017  . Acute stress reaction 03/01/2017  . Grief reaction 03/01/2017  . Lipoma of right shoulder 03/01/2017  . Gastroesophageal reflux disease with esophagitis 03/01/2017  . Stress at work 04/04/2017   Resolved Ambulatory Problems    Diagnosis Date Noted  . Mass of skin of right shoulder 01/21/2014   Past Medical History:  Diagnosis Date  . Depression   . Hx of adenomatous colonic polyps 05/20/2016  . Influenza   . Inverted nipple   . Lipoma of shoulder   . OA (osteoarthritis)   . Obesity      Review of Systems  All other systems reviewed and are negative.      Objective:   Physical Exam  Constitutional: She is oriented to person, place, and time. She appears well-developed and well-nourished.  HENT:  Head: Normocephalic and atraumatic.  Eyes: Pupils are equal, round, and reactive to light. Conjunctivae and EOM are normal.  Pulmonary/Chest: Effort normal. No respiratory distress.  Neurological: She is alert and oriented to person, place, and time.  Skin: Skin is warm and dry.  Lots of adipose distribution through hips and upper thighs.   Psychiatric: She has a normal mood and affect. Her behavior is normal. Thought content normal.      Assessment & Plan:  .Marland KitchenMarland KitchenDiagnoses and all orders for this visit:  Class 1 obesity  due to excess calories without serious comorbidity with body mass index (BMI) of 33.0 to 33.9 in adult -     Ambulatory referral to Plastic Surgery  Loose skin -     Ambulatory referral to Plastic Surgery   Discussed saxenda. Pt does not want to try an injectable. She would like 2nd opinion of adipose/extra skin removal of hips and knees. Will make referral.   ..Discussed low carb diet with 1500 calories and 80g of protein.  Exercising at least 150 minutes a week.  My Fitness Pal could be a Microbiologist.  Discussed intermittent fasting.  Consider slimroast for  weight loss.   Marland Kitchen.Spent 30 minutes with patient and greater than 50 percent of visit spent counseling patient regarding treatment plan.   Marland Kitchen

## 2017-05-28 NOTE — Progress Notes (Deleted)
10 year gastric by Pt

## 2017-06-20 ENCOUNTER — Ambulatory Visit: Payer: BC Managed Care – PPO | Admitting: Internal Medicine

## 2017-07-27 ENCOUNTER — Ambulatory Visit: Payer: BC Managed Care – PPO | Admitting: Internal Medicine

## 2017-08-29 ENCOUNTER — Encounter: Payer: Self-pay | Admitting: Family Medicine

## 2017-08-29 ENCOUNTER — Ambulatory Visit: Payer: BC Managed Care – PPO | Admitting: Family Medicine

## 2017-08-29 VITALS — BP 131/51 | HR 60 | Ht 64.0 in | Wt 197.0 lb

## 2017-08-29 DIAGNOSIS — M7661 Achilles tendinitis, right leg: Secondary | ICD-10-CM | POA: Diagnosis not present

## 2017-08-29 MED ORDER — DICLOFENAC SODIUM 1 % TD GEL: 2.0000 g | Freq: Four times a day (QID) | TRANSDERMAL | 11 refills | Status: DC

## 2017-08-29 NOTE — Patient Instructions (Addendum)
Thank you for coming in today. You have achilles tendonitis.  Do the heel exercises.  30 reps 3x daily.  Go from up to down slowly.   Apply the diclofenac gel up to 4x daily.   Recheck with me in 6 weeks.  Return sooner if not doing well.  Let me know if not improving.   We can start the nitroglycerine patches sooner.     Achilles Tendinitis Rehab Ask your health care provider which exercises are safe for you. Do exercises exactly as told by your health care provider and adjust them as directed. It is normal to feel mild stretching, pulling, tightness, or discomfort as you do these exercises, but you should stop right away if you feel sudden pain or your pain gets worse. Do not begin these exercises until told by your health care provider. Stretching and range of motion exercises These exercises warm up your muscles and joints and improve the movement and flexibility of your ankle. These exercises also help to relieve pain, numbness, and tingling. Exercise A: Standing wall calf stretch, knee straight  1. Stand with your hands against a wall. 2. Extend your __________ leg behind you and bend your front knee slightly. Keep both of your heels on the floor. 3. Point the toes of your back foot slightly inward. 4. Keeping your heels on the floor and your back knee straight, shift your weight toward the wall. Do not allow your back to arch. You should feel a gentle stretch in your calf. 5. Hold this position for seconds. Repeat __________ times. Complete this stretch __________ times per day. Exercise B: Standing wall calf stretch, knee bent 1. Stand with your hands against a wall. 2. Extend your __________ leg behind you, and bend your front knee slightly. Keep both of your heels on the floor. 3. Point the toes of your back foot slightly inward. 4. Keeping your heels on the floor, unlock your back knee so that it is bent. You should feel a gentle stretch deep in your calf. 5. Hold this  position for __________ seconds. Repeat __________ times. Complete this stretch __________ times per day. Strengthening exercises These exercises build strength and control of your ankle. Endurance is the ability to use your muscles for a long time, even after they get tired. Exercise C: Plantar flexion with band  1. Sit on the floor with your __________ leg extended. You may put a pillow under your calf to give your foot more room to move. 2. Loop a rubber exercise band or tube around the ball of your __________ foot. The ball of your foot is on the walking surface, right under your toes. The band or tube should be slightly tense when your foot is relaxed. If the band or tube slips, you can put on your shoe or put a washcloth between the band and your foot to help it stay in place. 3. Slowly point your toes downward, pushing them away from you. 4. Hold this position for __________ seconds. 5. Slowly release the tension in the band or tube, controlling smoothly until your foot is back to the starting position. Repeat __________ times. Complete this exercise __________ times per day. Exercise D: Heel raise with eccentric lower  1. Stand on a step with the balls of your feet. The ball of your foot is on the walking surface, right under your toes. ? Do not put your heels on the step. ? For balance, rest your hands on the wall or on  a railing. 2. Rise up onto the balls of your feet. 3. Keeping your heels up, shift all of your weight to your __________ leg and pick up your other leg. 4. Slowly lower your __________ leg so your heel drops below the level of the step. 5. Put down your foot. If told by your health care provider, build up to:  3 sets of 15 repetitions while keeping your knees straight.  3 sets of 15 repetitions while keeping your knees bent as far as told by your health care provider.  Complete this exercise __________ times per day. If this exercise is too easy, try doing it  while wearing a backpack with weights in it. Balance exercises These exercises improve or maintain your balance. Balance is important in preventing falls. Exercise E: Single leg stand 1. Without shoes, stand near a railing or in a door frame. Hold on to the railing or door frame as needed. 2. Stand on your __________ foot. Keep your big toe down on the floor and try to keep your arch lifted. 3. Hold this position for __________ seconds. Repeat __________ times. Complete this exercise __________ times per day. If this exercise is too easy, you can try it with your eyes closed or while standing on a pillow. This information is not intended to replace advice given to you by your health care provider. Make sure you discuss any questions you have with your health care provider. Document Released: 07/27/2004 Document Revised: 09/02/2015 Document Reviewed: 09/01/2014 Elsevier Interactive Patient Education  2018 [employer].    Nitroglycerin Protocol   Apply 1/4 nitroglycerin patch to affected area daily.  Change position of patch within the affected area every 24 hours.  You may experience a headache during the first 1-2 weeks of using the patch, these should subside.  If you experience headaches after beginning nitroglycerin patch treatment, you may take your preferred over the counter pain reliever.  Another side effect of the nitroglycerin patch is skin irritation or rash related to patch adhesive.  Please notify our office if you develop more severe headaches or rash, and stop the patch.  Tendon healing with nitroglycerin patch may require 12 to 24 weeks depending on the extent of injury.  Men should not use if taking Viagra, Cialis, or Levitra.   Do not use if you have migraines or rosacea.

## 2017-08-29 NOTE — Progress Notes (Signed)
Verbie Babic is a 62 y.o. female who presents to [facility] today for right heel pain.  Becky has a 22-month history of worsening pain in the right posterior calcaneus.  Pain is worse with standing from a seated position and with prolonged standing.  Additionally she notes significant pain when climbing stairs or going up hills.  She is tried some Tylenol which helped a bit.  She cannot take NSAIDs due to history of gastric bypass.  She notes the pain is been worsening recently and become quite significant.  She rates the pain is moderate to severe at times.  Pain is interfering her ability to walk normally and exercise normally.    ROS:  As above  Exam:  BP (!) 131/51   Pulse 60   Ht 5\' 4"  (1.626 m)   Wt 197 lb (89.4 kg)   BMI 33.81 kg/m  General: Well Developed, well nourished, and in no acute distress.  Neuro/Psych: Alert and oriented x3, extra-ocular muscles intact, able to move all 4 extremities, sensation grossly intact. Skin: Warm and dry, no rashes noted.  Respiratory: Not using accessory muscles, speaking in full sentences, trachea midline.  Cardiovascular: Pulses palpable, no extremity edema. Abdomen: Does not appear distended. MSK:   Right foot largely normal-appearing with no significant deformity.  Mild swelling at the posterior calcaneus without erythema. Foot is tender to palpation at the posterior calcaneus at the insertion of the Achilles tendon.  No tendon defects are palpated. Foot is nontender at the plantar calcaneus or in the forefoot or ankle. Foot and ankle motion are intact.  Stable ligamentous exam.  Pulses capillary fill and sensation are intact distally.   Lab and Radiology Results Limited musculoskeletal ultrasound of the right foot. Achilles tendon is normal-appearing and normal-sized just proximal to the insertion of the calcaneus.  At the insertion of the calcaneus she has areas of hyper and hypoechoic  change consistent appearance with Haglund deformity and heel spur. No significant retrocalcaneal bursitis present Achilles tendon is normal-appearing with the presence of a plantar heel spur. Bony structures otherwise normal.  Impression: Insertional Achilles tendinitis with Haglund deformity   Assessment and Plan: 62 y.o. female with  Insertional Achilles tendinitis with calcific change.  Plan for eccentric exercises ice massage and diclofenac gel.  Discussed nitroglycerin patches.  Patient would like to try diclofenac gel first.  Recheck in about 6 weeks.  Return sooner if needed.  Patient will let me know if she like to try the nitroglycerin patches prior to next visit.    No orders of the defined types were placed in this encounter.  Meds ordered this encounter  Medications  . diclofenac sodium (VOLTAREN) 1 % GEL    Sig: Apply 2 g topically 4 (four) times daily. To affected joint.    Dispense:  100 g    Refill:  11    Historical information moved to improve visibility of documentation.  Past Medical History:  Diagnosis Date  . Depression   . Hx of adenomatous colonic polyps 05/20/2016  . Influenza   . Inverted nipple    left side  . Lipoma of shoulder   . OA (osteoarthritis)    both knees, right hip  . Obesity    Past Surgical History:  Procedure Laterality Date  . ABDOMINAL HYSTERECTOMY    . ANTERIOR CERVICAL DECOMP/DISCECTOMY FUSION  1999  . BREAST EXCISIONAL BIOPSY Right   . BREAST LUMPECTOMY  1984  . BREAST REDUCTION SURGERY  Bilateral 1996  . CESAREAN SECTION    . CHOLECYSTECTOMY    . COLONOSCOPY    . DILATION AND CURETTAGE OF UTERUS  1982  . GASTRIC BYPASS  2000  . REDUCTION MAMMAPLASTY    . TONSILLECTOMY  1973  . WRIST FRACTURE SURGERY Right 2014   Social History   Tobacco Use  . Smoking status: Former Smoker    Last attempt to quit: 11/09/1985    Years since quitting: 31.8  . Smokeless tobacco: Never Used  Substance Use Topics  . Alcohol use: Yes      Alcohol/week: 0.0 standard drinks    Comment: 1-2 glasses per week (09/21/14)   family history includes Alcohol abuse in her brother and sister; Cancer in her father, maternal aunt, maternal aunt, mother, and paternal aunt; Cancer (age of onset: 41) in her sister; Colon cancer in her cousin, maternal aunt, maternal aunt, and other; Diabetes in her unknown relative; Hypertension in her unknown relative; Lung cancer in her mother.  Medications: Current Outpatient Medications  Medication Sig Dispense Refill  . acetaminophen (TYLENOL) 650 MG CR tablet Take 2 tablets (1,300 mg total) by mouth every 8 (eight) hours as needed for pain. 90 tablet 3  . Cyanocobalamin (B-12 PO) Take by mouth.    . Multiple Vitamin (MULTIVITAMIN) tablet Take 1 tablet by mouth daily.    Marland Kitchen omeprazole (PRILOSEC) 40 MG capsule Take 1 capsule (40 mg total) by mouth daily. 90 capsule 3  . traZODone (DESYREL) 50 MG tablet Take 0.5-1 tablets (25-50 mg total) by mouth at bedtime as needed for sleep. 30 tablet 3  . diclofenac sodium (VOLTAREN) 1 % GEL Apply 2 g topically 4 (four) times daily. To affected joint. 100 g 11   Current Facility-Administered Medications  Medication Dose Route Frequency Provider Last Rate Last Dose  . 0.9 %  sodium chloride infusion  500 mL Intravenous Continuous Gatha Mayer, MD       Allergies  Allergen Reactions  . Adhesive [Tape]     Skin blisters  . Morphine And Related   . Sulfa Antibiotics Rash      Discussed warning signs or symptoms. Please see discharge instructions. Patient expresses understanding.

## 2017-10-02 ENCOUNTER — Ambulatory Visit: Payer: BC Managed Care – PPO | Admitting: Internal Medicine

## 2017-10-19 ENCOUNTER — Ambulatory Visit: Payer: BC Managed Care – PPO | Admitting: Family Medicine

## 2017-11-20 ENCOUNTER — Encounter: Payer: Self-pay | Admitting: Physician Assistant

## 2017-11-20 ENCOUNTER — Ambulatory Visit (INDEPENDENT_AMBULATORY_CARE_PROVIDER_SITE_OTHER): Payer: BC Managed Care – PPO | Admitting: Physician Assistant

## 2017-11-20 VITALS — BP 119/57 | HR 60 | Temp 98.2°F | Ht 64.0 in | Wt 196.0 lb

## 2017-11-20 DIAGNOSIS — H60331 Swimmer's ear, right ear: Secondary | ICD-10-CM

## 2017-11-20 DIAGNOSIS — H6123 Impacted cerumen, bilateral: Secondary | ICD-10-CM

## 2017-11-20 DIAGNOSIS — H9201 Otalgia, right ear: Secondary | ICD-10-CM | POA: Diagnosis not present

## 2017-11-20 DIAGNOSIS — M7711 Lateral epicondylitis, right elbow: Secondary | ICD-10-CM

## 2017-11-20 MED ORDER — HYDROCODONE-ACETAMINOPHEN 5-325 MG PO TABS: 1.0000 | ORAL_TABLET | Freq: Three times a day (TID) | ORAL | 0 refills | Status: AC | PRN

## 2017-11-20 MED ORDER — HYDROCODONE-ACETAMINOPHEN 5-325 MG PO TABS: 1.0000 | ORAL_TABLET | Freq: Three times a day (TID) | ORAL | 0 refills | Status: DC | PRN

## 2017-11-20 MED ORDER — KETOROLAC TROMETHAMINE 60 MG/2ML IM SOLN
60.0000 mg | Freq: Once | INTRAMUSCULAR | Status: AC
  Administered 2017-11-20: 60 mg via INTRAMUSCULAR

## 2017-11-20 MED ORDER — OFLOXACIN 0.3 % OT SOLN: 10.0000 [drp] | Freq: Every day | OTIC | 0 refills | Status: DC

## 2017-11-20 NOTE — Patient Instructions (Signed)
Tennis Elbow Tennis elbow is puffiness (inflammation) of the outer tendons of your forearm close to your elbow. Your tendons attach your muscles to your bones. Tennis elbow can happen in any sport or job in which you use your elbow too much. It is caused by doing the same motion over and over. Tennis elbow can cause:  Pain and tenderness in your forearm and the outer part of your elbow.  A burning feeling. This runs from your elbow through your arm.  Weak grip in your hands.  Follow these instructions at home: Activity  Rest your elbow and wrist as told by your doctor. Try to avoid any activities that caused the problem until your doctor says that you can do them again.  If a physical therapist teaches you exercises, do all of them as told.  If you lift an object, lift it with your palm facing up. This is easier on your elbow. Lifestyle  If your tennis elbow is caused by sports, check your equipment and make sure that: ? You are using it correctly. ? It fits you well.  If your tennis elbow is caused by work, take breaks often, if you are able. Talk with your manager about doing your work in a way that is safe for you. ? If your tennis elbow is caused by computer use, talk with your manager about any changes that can be made to your work setup. General instructions  If told, apply ice to the painful area: ? Put ice in a plastic bag. ? Place a towel between your skin and the bag. ? Leave the ice on for 20 minutes, 2-3 times per day.  Take medicines only as told by your doctor.  If you were given a brace, wear it as told by your doctor.  Keep all follow-up visits as told by your doctor. This is important. Contact a doctor if:  Your pain does not get better with treatment.  Your pain gets worse.  You have weakness in your forearm, hand, or fingers.  You cannot feel your forearm, hand, or fingers. This information is not intended to replace advice given to you by your health  care provider. Make sure you discuss any questions you have with your health care provider. Document Released: 06/15/2009 Document Revised: 08/26/2015 Document Reviewed: 12/22/2013 Elsevier Interactive Patient Education  [employer].

## 2017-11-20 NOTE — Progress Notes (Signed)
Subjective:    Patient ID: Julia Larson, female    DOB: [birth year], 62 y.o.   MRN: [id number]  HPI Pt is a 62 yo female who presents to the clinic with right ear pain and discharge for 2 days.   She has not tried anything to make the pain better. She denies any fever, chills, body aches. She does have a hx of cerumen impaction.  Last week her right elbow started hurting. At times radiates down into forearm. No injury. Not tried anything to make better. Due to gastric bypass not supposed to take NSAIDS. 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   .. Active Ambulatory Problems    Diagnosis Date Noted  . H/O gastric bypass 01/21/2014  . History of diabetes mellitus 01/21/2014  . Primary osteoarthritis of right hip 01/26/2014  . Primary osteoarthritis of both knees 02/17/2014  . Lipoma of shoulder 02/17/2014  . Nocturnal muscle cramps 05/28/2014  . Lumbar radiculopathy 09/21/2014  . RLS (restless legs syndrome) 09/21/2014  . Fatigue 09/21/2014  . Actinic keratosis due to exposure to sunlight 10/16/2014  . Low iron stores 10/16/2014  . Obesity 08/04/2015  . Depression 08/04/2015  . Hx of adenomatous colonic polyps 05/20/2016  . Family history of stomach cancer 03/01/2017  . Family history of colon cancer 03/01/2017  . Primary insomnia 03/01/2017  . Gastroesophageal reflux disease with esophagitis 03/01/2017  . Stress at work 04/04/2017  . Achilles tendinitis of right lower extremity 08/29/2017   Resolved Ambulatory Problems    Diagnosis Date Noted  . Shingles outbreak 01/21/2014  . De Quervain's tenosynovitis, right 01/21/2014  . Mass of skin of right shoulder 01/21/2014  . Right wrist pain 01/26/2014  . Right leg pain 01/26/2014  . Solar lentigo 10/16/2014  . Breast pain, left 10/22/2014  . Cough 03/01/2017  . Class 1 obesity due to excess calories without serious comorbidity with body mass index (BMI) of 33.0 to 33.9 in adult 03/01/2017  . Acute  stress reaction 03/01/2017  . Grief reaction 03/01/2017  . Lipoma of right shoulder 03/01/2017   Past Medical History:  Diagnosis Date  . Influenza   . Inverted nipple   . OA (osteoarthritis)       Review of Systems    see HPI>  Objective:   Physical Exam  Constitutional: She is oriented to person, place, and time. She appears well-developed.  HENT:  Head: Normocephalic and atraumatic.  Nose: Nose normal.  Mouth/Throat: Oropharynx is clear and moist. No oropharyngeal exudate.  Bilateral cerumen impaction and not able to view TM.   After irrigation: Right TM with erythema and white clumpy discharge.  Left TM slight erythema.  Eyes: Conjunctivae are normal. Right eye exhibits no discharge. Left eye exhibits no discharge.  Cardiovascular: Normal rate and regular rhythm.  Pulmonary/Chest: Effort normal.  Musculoskeletal:  Tenderness over right lateral epicondyle. No erythema or swelling.  Lymphadenopathy:    She has no cervical adenopathy.  Neurological: She is alert and oriented to person, place, and time.  Psychiatric: She has a normal mood and affect. Her behavior is normal.          Assessment & Plan:  Marland KitchenMarland KitchenDiagnoses and all orders for this visit:  Right ear pain  Right tennis elbow -     HYDROcodone-acetaminophen (NORCO/VICODIN) 5-325 MG tablet; Take 1 tablet by mouth every 8 (eight) hours as needed for up to 5 days for moderate pain. -     ketorolac (  TORADOL) injection 60 mg  Bilateral impacted cerumen  Acute swimmer's ear of right side -     ofloxacin (FLOXIN) 0.3 % OTIC solution; Place 10 drops into the right ear daily. For 7 days.  Other orders -     Discontinue: HYDROcodone-acetaminophen (NORCO/VICODIN) 5-325 MG tablet; Take 1 tablet by mouth every 8 (eight) hours as needed for up to 5 days for moderate pain.  Marland Kitchen.Cerumen Removal Template: Indication: Cerumen impaction of the ear(s) Medical necessity statement: On physical examination, cerumen impairs  clinically significant portions of the external auditory canal, and tympanic membrane. Noted obstructive, copious cerumen that cannot be removed without magnification and instrumentations requiring physician skills Consent: Discussed benefits and risks of procedure and verbal consent obtained Procedure: Patient was prepped for the procedure. Utilized an otoscope to assess and take note of the ear canal, the tympanic membrane, and the presence, amount, and placement of the cerumen. Gentle water irrigation and soft plastic curette was utilized to remove cerumen.  Post procedure examination shows cerumen was completely removed. Patient tolerated procedure well. The patient is made aware that they may experience temporary vertigo, temporary hearing loss, and temporary discomfort. If these symptom last for more than 24 hours to call the clinic or proceed to the ED.  Appears like otitis externa. Sent ofloxin for 7 days. HO given. Left ear feels much better after irrigation.   Get OTC elbow brace. Use diclofenac gel 4 times a day as needed. Ice 1-2 times a day. Wrote letter to limit typing to 5 hours a day for next 2 months. Hopefully that will take some of the strain off that elbow.

## 2017-11-25 ENCOUNTER — Encounter: Payer: Self-pay | Admitting: Physician Assistant

## 2017-12-19 ENCOUNTER — Telehealth: Payer: Self-pay

## 2017-12-19 NOTE — Telephone Encounter (Signed)
Janese called today saying that she is still in really bad pain from her last appointment in November. Patient states her place of work has not been abiding by the note you wrote for her as she continues to work 9 hour days. Patient states she is experiencing hot shooting pains in her heels, along with the same tendonitis pain in her arm. Patient also states she wants to come in and see you again, but her only day off is this upcoming Monday. I saw that you are completely booked for that day, but she was wanting me to ask and see if we could possible double book her? Or she is even willing to walk in and wait anytime after 3:00 to be seen by you. I wanted to ask you first, because I have never double booked a patient so I just wanted your approval. Please advise. Thanks Luvenia Starch!

## 2017-12-24 ENCOUNTER — Ambulatory Visit: Payer: BC Managed Care – PPO | Admitting: Family Medicine

## 2017-12-24 ENCOUNTER — Encounter: Payer: Self-pay | Admitting: Family Medicine

## 2017-12-24 VITALS — BP 137/63 | HR 61 | Ht 64.0 in | Wt 197.0 lb

## 2017-12-24 DIAGNOSIS — M7661 Achilles tendinitis, right leg: Secondary | ICD-10-CM

## 2017-12-24 DIAGNOSIS — M7711 Lateral epicondylitis, right elbow: Secondary | ICD-10-CM | POA: Diagnosis not present

## 2017-12-24 MED ORDER — NITROGLYCERIN 0.2 MG/HR TD PT24: MEDICATED_PATCH | TRANSDERMAL | 1 refills | Status: DC

## 2017-12-24 NOTE — Progress Notes (Signed)
Julia Larson is a 62 y.o. female who presents to [place] today for follow-up on tennis elbow and heel spur.   Tennis elbow: Julia Larson saw Julia Planas, PA, for tennis elbow on 11/20/17. At that time, she received a toradol injection and was given a note to limit her time spent typing at work to 5 hours. Today, she states that her tennis elbow is worse. The toradol injection helped for only one week. Also, her employer has not allowed her to decrease her hours spent typing, and she is working mandatory overtime up to 9 hours/day. At the end of the day, the pain extends up to her shoulder. She wears a counterforce brace and a wrist brace on the right while she is at work, which only mildly reduces the pain. She is not able to take anti-inflammatory medication due to gastric bypass surgery.  Right Achilles tendinitis: Julia Larson says that her heel pain is worsening, despite doing the exercises and using diclofenac gel. It hurts even if she is sitting still or driving, and feels like someone is "driving a hot poker up my heel".  Pain is worse when standing up from a seated position or climbing a hill.    ROS:  As above  Exam:  BP 137/63   Pulse 61   Ht 5\' 4"  (1.626 m)   Wt 197 lb (89.4 kg)   BMI 33.81 kg/m  General: Well Developed, well nourished, and in no acute distress.  Neuro/Psych: Alert and oriented x3, extra-ocular muscles intact, able to move all 4 extremities, sensation grossly intact. Skin: Warm and dry, no rashes noted.  Respiratory: Not using accessory muscles, speaking in full sentences, trachea midline.  Cardiovascular: Pulses palpable, no extremity edema. Abdomen: Does not appear distended. MSK:  Right elbow: Normal-appearing.  Tender to palpation over lateral epicondyle.  ROM normal.  Resisted extension of the middle finger reproduces pain at the lateral epicondyle and over flexor carpi ulnaris muscle.  Resisted wrist  dorsiflexion reproduces pain.  Pulses and capillary refill normal.  Distal strength to the wrist and hand are intact.  Right heel: Normal-appearing with no significant swelling. Normal foot and ankle motion. Tender palpation distal Achilles tendon as it inserts into the posterior calcaneus. Additionally mildly tender palpation plantar medial calcaneus. Pulses capillary fill and sensation are intact distally.  ; Lab and Radiology Results  Limited musculoskeletal ultrasound right posterior Achilles tendon reveals intact Achilles tendon onto the insertion of the calcaneus.  Hyperechoic changes present at the insertion consistent with Haglund deformity.  Additionally significant neovascularity present on Doppler ultrasound superficial to the Achilles tendon consistent with Achilles tendinitis.  No tear visible.  Normal bony structures otherwise.  Procedure: Real-time Ultrasound Guided Injection of right lateral epicondyle insertion common extensor tendon Device: GE Logiq E   Images permanently stored and available for review in the ultrasound unit. Verbal informed consent obtained.  Discussed risks and benefits of procedure. Warned about infection bleeding damage to structures skin hypopigmentation and fat atrophy among others. Patient expresses understanding and agreement Time-out conducted.   Noted no overlying erythema, induration, or other signs of local infection.   Skin prepped in a sterile fashion.   Local anesthesia: Topical Ethyl chloride.   With sterile technique and under real time ultrasound guidance:  30 mg of Kenalog and 1.5 mL of Marcaine injected easily.   Completed without difficulty   Pain immediately resolved suggesting accurate placement of the medication.   Advised to call  if fevers/chills, erythema, induration, drainage, or persistent bleeding.   Images permanently stored and available for review in the ultrasound unit.  Impression: Technically successful ultrasound  guided injection.         Assessment and Plan: 62 y.o. female with  Right tennis elbow: Not improving. Gave injection of right lateral epicondyle. Pt noted mild improvement after injection. Continue wearing counterforce and wrist braces. Recommended home exercises focused on stretching and eccentric exercises.  Discussed with pt that I will fill out more work forms for her if she needs to. Follow-up in one month if needed.   Right Achilles tendinitis: Julia Larson' heel spur symptoms are worsening. Heel spur visualized on ultrasound. Continue doing exercises and stretching.  Plan for nitroglycerin protocol in addition to eccentric exercises.  Discussed side effects with pt and she is aware that she can stop using the patches if it doesn't help. Follow-up in one month if needed.    No orders of the defined types were placed in this encounter.  Meds ordered this encounter  Medications  . nitroGLYCERIN (NITRODUR - DOSED IN MG/24 HR) 0.2 mg/hr patch    Sig: Apply 1/4 patch to the heel for tenonitis daily    Dispense:  30 patch    Refill:  1    Historical information moved to improve visibility of documentation.  Past Medical History:  Diagnosis Date  . Depression   . Hx of adenomatous colonic polyps 05/20/2016  . Influenza   . Inverted nipple    left side  . Lipoma of shoulder   . OA (osteoarthritis)    both knees, right hip  . Obesity    Past Surgical History:  Procedure Laterality Date  . ABDOMINAL HYSTERECTOMY    . ANTERIOR CERVICAL DECOMP/DISCECTOMY FUSION  1999  . BREAST EXCISIONAL BIOPSY Right   . BREAST LUMPECTOMY  1984  . BREAST REDUCTION SURGERY Bilateral 1996  . CESAREAN SECTION    . CHOLECYSTECTOMY    . COLONOSCOPY    . DILATION AND CURETTAGE OF UTERUS  1982  . GASTRIC BYPASS  2000  . REDUCTION MAMMAPLASTY    . TONSILLECTOMY  1973  . WRIST FRACTURE SURGERY Right 2014   Social History   Tobacco Use  . Smoking status: Former Smoker    Last attempt to quit:  11/09/1985    Years since quitting: 32.1  . Smokeless tobacco: Never Used  Substance Use Topics  . Alcohol use: Yes    Alcohol/week: 0.0 standard drinks    Comment: 1-2 glasses per week (09/21/14)   family history includes Alcohol abuse in her brother and sister; Cancer in her father, maternal aunt, maternal aunt, mother, and paternal aunt; Cancer (age of onset: 23) in her sister; Colon cancer in her cousin, maternal aunt, maternal aunt, and another family member; Diabetes in her unknown relative; Hypertension in her unknown relative; Lung cancer in her mother.  Medications: Current Outpatient Medications  Medication Sig Dispense Refill  . acetaminophen (TYLENOL) 650 MG CR tablet Take 2 tablets (1,300 mg total) by mouth every 8 (eight) hours as needed for pain. 90 tablet 3  . Cyanocobalamin (B-12 PO) Take by mouth.    . diclofenac sodium (VOLTAREN) 1 % GEL Apply 2 g topically 4 (four) times daily. To affected joint. 100 g 11  . Multiple Vitamin (MULTIVITAMIN) tablet Take 1 tablet by mouth daily.    Marland Kitchen ofloxacin (FLOXIN) 0.3 % OTIC solution Place 10 drops into the right ear daily. For 7 days. 10 mL  0  . omeprazole (PRILOSEC) 40 MG capsule Take 1 capsule (40 mg total) by mouth daily. 90 capsule 3  . traZODone (DESYREL) 50 MG tablet Take 0.5-1 tablets (25-50 mg total) by mouth at bedtime as needed for sleep. 30 tablet 3   Current Facility-Administered Medications  Medication Dose Route Frequency Provider Last Rate Last Dose  . 0.9 %  sodium chloride infusion  500 mL Intravenous Continuous Gatha Mayer, MD       Allergies  Allergen Reactions  . Adhesive [Tape]     Skin blisters  . Morphine And Related   . Sulfa Antibiotics Rash      Discussed warning signs or symptoms. Please see discharge instructions. Patient expresses understanding.  I personally was present and performed or re-performed the history, physical exam and medical decision-making activities of this service and have  verified that the service and findings are accurately documented in the student's note. ___________________________________________ Lynne Leader M.D., ABFM., CAQSM. Primary Care and Sports Medicine Adjunct Instructor of [place] [employer] [facility] of Medicine

## 2017-12-24 NOTE — Telephone Encounter (Signed)
Patient is scheduled to see Dr. Georgina Snell today at 2:40 for the pain. Thanks Luvenia Starch!

## 2017-12-24 NOTE — Patient Instructions (Signed)
Thank you for coming in today. Call or go to the ER if you develop a large red swollen joint with extreme pain or oozing puss.  Do the exercises and stretch.  Let the wrist go down slowly. Do about 39 reps 2-3 daily.  Use the nitropatch on the heel.  Recheck in 1 month or sooner if needed.   Nitroglycerin Protocol   Apply 1/4 nitroglycerin patch to affected area daily.  Change position of patch within the affected area every 24 hours.  You may experience a headache during the first 1-2 weeks of using the patch, these should subside.  If you experience headaches after beginning nitroglycerin patch treatment, you may take your preferred over the counter pain reliever.  Another side effect of the nitroglycerin patch is skin irritation or rash related to patch adhesive.  Please notify our office if you develop more severe headaches or rash, and stop the patch.  Tendon healing with nitroglycerin patch may require 12 to 24 weeks depending on the extent of injury.  Men should not use if taking Viagra, Cialis, or Levitra.   Do not use if you have migraines or rosacea.     Tennis Elbow Tennis elbow is puffiness (inflammation) of the outer tendons of your forearm close to your elbow. Your tendons attach your muscles to your bones. Tennis elbow can happen in any sport or job in which you use your elbow too much. It is caused by doing the same motion over and over. Tennis elbow can cause:  Pain and tenderness in your forearm and the outer part of your elbow.  A burning feeling. This runs from your elbow through your arm.  Weak grip in your hands.  Follow these instructions at home: Activity  Rest your elbow and wrist as told by your doctor. Try to avoid any activities that caused the problem until your doctor says that you can do them again.  If a physical therapist teaches you exercises, do all of them as told.  If you lift an object, lift it with your palm facing up. This is  easier on your elbow. Lifestyle  If your tennis elbow is caused by sports, check your equipment and make sure that: ? You are using it correctly. ? It fits you well.  If your tennis elbow is caused by work, take breaks often, if you are able. Talk with your manager about doing your work in a way that is safe for you. ? If your tennis elbow is caused by computer use, talk with your manager about any changes that can be made to your work setup. General instructions  If told, apply ice to the painful area: ? Put ice in a plastic bag. ? Place a towel between your skin and the bag. ? Leave the ice on for 20 minutes, 2-3 times per day.  Take medicines only as told by your doctor.  If you were given a brace, wear it as told by your doctor.  Keep all follow-up visits as told by your doctor. This is important. Contact a doctor if:  Your pain does not get better with treatment.  Your pain gets worse.  You have weakness in your forearm, hand, or fingers.  You cannot feel your forearm, hand, or fingers. This information is not intended to replace advice given to you by your health care provider. Make sure you discuss any questions you have with your health care provider. Document Released: 06/15/2009 Document Revised: 08/26/2015 Document Reviewed: 12/22/2013  Chartered certified accountant Patient Education  [employer].

## 2017-12-24 NOTE — Telephone Encounter (Signed)
Really needs to see ortho. Explain I am actually leaving early for a herniated disc. See if ortho will work her in? Dr. Georgina Snell has early morning appts.

## 2018-01-21 ENCOUNTER — Encounter: Payer: Self-pay | Admitting: Family Medicine

## 2018-01-21 ENCOUNTER — Ambulatory Visit (INDEPENDENT_AMBULATORY_CARE_PROVIDER_SITE_OTHER): Payer: BC Managed Care – PPO | Admitting: Family Medicine

## 2018-01-21 ENCOUNTER — Ambulatory Visit: Payer: BC Managed Care – PPO | Admitting: Family Medicine

## 2018-01-21 VITALS — BP 135/51 | HR 65 | Ht 64.0 in | Wt 195.0 lb

## 2018-01-21 DIAGNOSIS — M7661 Achilles tendinitis, right leg: Secondary | ICD-10-CM

## 2018-01-21 DIAGNOSIS — M7711 Lateral epicondylitis, right elbow: Secondary | ICD-10-CM | POA: Diagnosis not present

## 2018-01-21 NOTE — Patient Instructions (Signed)
Thank you for coming in today. Continue the exercises  Use the topical medicines as needed.   Use the heel lift insoles.   Recheck with me as needed.

## 2018-01-21 NOTE — Progress Notes (Signed)
Julia Larson is a 63 y.o. female who presents to [place] today for follow-up on right tennis elbow and right achilles tendinitis.   Tennis elbow: At her last visit, Julia Larson had a right lateral epicondyle insertion injection. Since then, she has also been on Christmas break from her job, so she has been able to rest. Today, her symptoms are completely improved. She has not needed the counterforce brace or the wrist brace, as her pain is controlled. She has returned to her normal hours at work which also helps prevent pain.  Right achilles tendonitis: Julia Larson wore the nitroglycerin patches for two weeks, but discontinued due to severe headaches and dizziness. She still has regular pain that feels like a hot shooting pain up the back of her leg. This occurs when she is walking or driving. She is still doing her exercises and using diclofenac gel.    ROS:  As above  Exam:  BP (!) 135/51   Pulse 65   Ht 5\' 4"  (1.626 m)   Wt 195 lb (88.5 kg)   BMI 33.47 kg/m  Wt Readings from Last 5 Encounters:  01/21/18 195 lb (88.5 kg)  12/24/17 197 lb (89.4 kg)  11/20/17 196 lb (88.9 kg)  08/29/17 197 lb (89.4 kg)  05/28/17 192 lb (87.1 kg)   General: Well Developed, well nourished, and in no acute distress.  Neuro/Psych: Alert and oriented x3, extra-ocular muscles intact, able to move all 4 extremities, sensation grossly intact. Skin: Warm and dry, no rashes noted.  Respiratory: Not using accessory muscles, speaking in full sentences, trachea midline.  Cardiovascular: Pulses palpable, no extremity edema. Abdomen: Does not appear distended. MSK:  Right foot: Normal-appearing without erythema, edema, rashes, or scar.  Heel and achilles tendon mildly tender to palpation.  Normal gait and strength.   Lab and Radiology Results No results found for this or any previous visit (from the past 72 hour(s)). No results found.   Assessment and  Plan: 63 y.o. female with  Right tennis elbow: Her tennis elbow has completely improved. Advised her to continue home exercises. No further treatment needed at this time.   Right achilles tendonitis: Ms. Julia Larson stopped using the nitroglycerin patches due to severe headache and dizziness. Her symptoms have not improved. Discussed future option for surgery with the patient. She would like to continue conservative management. Continue doing exercises and stretching. Use diclofenac gel as needed. Use heel lift insoles. Follow-up as needed.  Discussed PRP.  Patient declined at this time.   PDMP not reviewed this encounter. No orders of the defined types were placed in this encounter.  No orders of the defined types were placed in this encounter.   Historical information moved to improve visibility of documentation.  Past Medical History:  Diagnosis Date  . Depression   . Hx of adenomatous colonic polyps 05/20/2016  . Influenza   . Inverted nipple    left side  . Lipoma of shoulder   . OA (osteoarthritis)    both knees, right hip  . Obesity    Past Surgical History:  Procedure Laterality Date  . ABDOMINAL HYSTERECTOMY    . ANTERIOR CERVICAL DECOMP/DISCECTOMY FUSION  1999  . BREAST EXCISIONAL BIOPSY Right   . BREAST LUMPECTOMY  1984  . BREAST REDUCTION SURGERY Bilateral 1996  . CESAREAN SECTION    . CHOLECYSTECTOMY    . COLONOSCOPY    . DILATION AND CURETTAGE OF UTERUS  1982  . GASTRIC  BYPASS  2000  . REDUCTION MAMMAPLASTY    . TONSILLECTOMY  1973  . WRIST FRACTURE SURGERY Right 2014   Social History   Tobacco Use  . Smoking status: Former Smoker    Last attempt to quit: 11/09/1985    Years since quitting: 32.2  . Smokeless tobacco: Never Used  Substance Use Topics  . Alcohol use: Yes    Alcohol/week: 0.0 standard drinks    Comment: 1-2 glasses per week (09/21/14)   family history includes Alcohol abuse in her brother and sister; Cancer in her father, maternal aunt,  maternal aunt, mother, and paternal aunt; Cancer (age of onset: 5) in her sister; Colon cancer in her cousin, maternal aunt, maternal aunt, and another family member; Diabetes in her unknown relative; Hypertension in her unknown relative; Lung cancer in her mother.  Medications: Current Outpatient Medications  Medication Sig Dispense Refill  . acetaminophen (TYLENOL) 650 MG CR tablet Take 2 tablets (1,300 mg total) by mouth every 8 (eight) hours as needed for pain. 90 tablet 3  . Cyanocobalamin (B-12 PO) Take by mouth.    . diclofenac sodium (VOLTAREN) 1 % GEL Apply 2 g topically 4 (four) times daily. To affected joint. 100 g 11  . Multiple Vitamin (MULTIVITAMIN) tablet Take 1 tablet by mouth daily.    . nitroGLYCERIN (NITRODUR - DOSED IN MG/24 HR) 0.2 mg/hr patch Apply 1/4 patch to the heel for tenonitis daily 30 patch 1  . ofloxacin (FLOXIN) 0.3 % OTIC solution Place 10 drops into the right ear daily. For 7 days. 10 mL 0  . omeprazole (PRILOSEC) 40 MG capsule Take 1 capsule (40 mg total) by mouth daily. 90 capsule 3  . traZODone (DESYREL) 50 MG tablet Take 0.5-1 tablets (25-50 mg total) by mouth at bedtime as needed for sleep. 30 tablet 3   Current Facility-Administered Medications  Medication Dose Route Frequency Provider Last Rate Last Dose  . 0.9 %  sodium chloride infusion  500 mL Intravenous Continuous Gatha Mayer, MD       Allergies  Allergen Reactions  . Adhesive [Tape]     Skin blisters  . Morphine And Related   . Sulfa Antibiotics Rash      Discussed warning signs or symptoms. Please see discharge instructions. Patient expresses understanding.   I personally was present and performed or re-performed the history, physical exam and medical decision-making activities of this service and have verified that the service and findings are accurately documented in the student's note. ___________________________________________ Lynne Leader M.D., ABFM., CAQSM. Primary Care and  Sports Medicine Adjunct Instructor of [employer] [facility] of Medicine

## 2018-01-28 ENCOUNTER — Encounter: Payer: Self-pay | Admitting: Physician Assistant

## 2018-01-28 ENCOUNTER — Ambulatory Visit (INDEPENDENT_AMBULATORY_CARE_PROVIDER_SITE_OTHER): Payer: BC Managed Care – PPO | Admitting: Physician Assistant

## 2018-01-28 VITALS — BP 129/67 | HR 53 | Temp 97.9°F | Wt 196.0 lb

## 2018-01-28 DIAGNOSIS — R5383 Other fatigue: Secondary | ICD-10-CM

## 2018-01-28 DIAGNOSIS — Z1329 Encounter for screening for other suspected endocrine disorder: Secondary | ICD-10-CM

## 2018-01-28 DIAGNOSIS — F419 Anxiety disorder, unspecified: Secondary | ICD-10-CM

## 2018-01-28 DIAGNOSIS — F32A Depression, unspecified: Secondary | ICD-10-CM

## 2018-01-28 DIAGNOSIS — G4719 Other hypersomnia: Secondary | ICD-10-CM | POA: Diagnosis not present

## 2018-01-28 DIAGNOSIS — F329 Major depressive disorder, single episode, unspecified: Secondary | ICD-10-CM

## 2018-01-28 DIAGNOSIS — R0989 Other specified symptoms and signs involving the circulatory and respiratory systems: Secondary | ICD-10-CM

## 2018-01-28 DIAGNOSIS — R0789 Other chest pain: Secondary | ICD-10-CM | POA: Diagnosis not present

## 2018-01-28 DIAGNOSIS — Z9189 Other specified personal risk factors, not elsewhere classified: Secondary | ICD-10-CM

## 2018-01-28 DIAGNOSIS — R001 Bradycardia, unspecified: Secondary | ICD-10-CM | POA: Diagnosis not present

## 2018-01-28 DIAGNOSIS — Z9884 Bariatric surgery status: Secondary | ICD-10-CM

## 2018-01-28 DIAGNOSIS — E569 Vitamin deficiency, unspecified: Secondary | ICD-10-CM

## 2018-01-28 DIAGNOSIS — R031 Nonspecific low blood-pressure reading: Secondary | ICD-10-CM

## 2018-01-28 DIAGNOSIS — R51 Headache: Secondary | ICD-10-CM

## 2018-01-28 DIAGNOSIS — Z8639 Personal history of other endocrine, nutritional and metabolic disease: Secondary | ICD-10-CM

## 2018-01-28 DIAGNOSIS — H8113 Benign paroxysmal vertigo, bilateral: Secondary | ICD-10-CM

## 2018-01-28 DIAGNOSIS — R519 Headache, unspecified: Secondary | ICD-10-CM

## 2018-01-28 NOTE — Patient Instructions (Signed)
Benign Positional Vertigo  Vertigo is the feeling that you or your surroundings are moving when they are not. Benign positional vertigo is the most common form of vertigo. This is usually a harmless condition (benign). This condition is positional. This means that symptoms are triggered by certain movements and positions.  This condition can be dangerous if it occurs while you are doing something that could cause harm to you or others. This includes activities such as driving or operating machinery.  What are the causes?  In many cases, the cause of this condition is not known. It may be caused by a disturbance in an area of the inner ear that helps your brain to sense movement and balance. This disturbance can be caused by:   Viral infection (labyrinthitis).   Head injury.   Repetitive motion, such as jumping, dancing, or running.  What increases the risk?  You are more likely to develop this condition if:   You are a woman.   You are 50 years of age or older.  What are the signs or symptoms?  Symptoms of this condition usually happen when you move your head or your eyes in different directions. Symptoms may start suddenly, and usually last for less than a minute. They include:   Loss of balance and falling.   Feeling like you are spinning or moving.   Feeling like your surroundings are spinning or moving.   Nausea and vomiting.   Blurred vision.   Dizziness.   Involuntary eye movement (nystagmus).  Symptoms can be mild and cause only minor problems, or they can be severe and interfere with daily life. Episodes of benign positional vertigo may return (recur) over time. Symptoms may improve over time.  How is this diagnosed?  This condition may be diagnosed based on:   Your medical history.   Physical exam of the head, neck, and ears.   Tests, such as:  ? MRI.  ? CT scan.  ? Eye movement tests. Your health care provider may ask you to change positions quickly while he or she watches you for symptoms  of benign positional vertigo, such as nystagmus. Eye movement may be tested with a variety of exams that are designed to evaluate or stimulate vertigo.  ? An electroencephalogram (EEG). This records electrical activity in your brain.  ? Hearing tests.  You may be referred to a health care provider who specializes in ear, nose, and throat (ENT) problems (otolaryngologist) or a provider who specializes in disorders of the nervous system (neurologist).  How is this treated?    This condition may be treated in a session in which your health care provider moves your head in specific positions to adjust your inner ear back to normal. Treatment for this condition may take several sessions. Surgery may be needed in severe cases, but this is rare.  In some cases, benign positional vertigo may resolve on its own in 2-4 weeks.  Follow these instructions at home:  Safety   Move slowly. Avoid sudden body or head movements or certain positions, as told by your health care provider.   Avoid driving until your health care provider says it is safe for you to do so.   Avoid operating heavy machinery until your health care provider says it is safe for you to do so.   Avoid doing any tasks that would be dangerous to you or others if vertigo occurs.   If you have trouble walking or keeping your balance, try using   a cane for stability. If you feel dizzy or unstable, sit down right away.   Return to your normal activities as told by your health care provider. Ask your health care provider what activities are safe for you.  General instructions   Take over-the-counter and prescription medicines only as told by your health care provider.   Drink enough fluid to keep your urine pale yellow.   Keep all follow-up visits as told by your health care provider. This is important.  Contact a health care provider if:   You have a fever.   Your condition gets worse or you develop new symptoms.   Your family or friends notice any  behavioral changes.   You have nausea or vomiting that gets worse.   You have numbness or a "pins and needles" sensation.  Get help right away if you:   Have difficulty speaking or moving.   Are always dizzy.   Faint.   Develop severe headaches.   Have weakness in your legs or arms.   Have changes in your hearing or vision.   Develop a stiff neck.   Develop sensitivity to light.  Summary   Vertigo is the feeling that you or your surroundings are moving when they are not. Benign positional vertigo is the most common form of vertigo.   The cause of this condition is not known. It may be caused by a disturbance in an area of the inner ear that helps your brain to sense movement and balance.   Symptoms include loss of balance and falling, feeling that you or your surroundings are moving, nausea and vomiting, and blurred vision.   This condition can be diagnosed based on symptoms, physical exam, and other tests, such as MRI, CT scan, eye movement tests, and hearing tests.   Follow safety instructions as told by your health care provider. You will also be told when to contact your health care provider in case of problems.  This information is not intended to replace advice given to you by your health care provider. Make sure you discuss any questions you have with your health care provider.  Document Released: 10/03/2005 Document Revised: 06/06/2017 Document Reviewed: 06/06/2017  Elsevier Interactive Patient Education  2019 [employer]

## 2018-01-28 NOTE — Progress Notes (Signed)
HPI:                                                                Julia Larson is a 63 y.o. female who presents to [facility] today for headache/dizziness  For the last 2 weeks "something has been off." She feels extremely tired and is following asleep mid-day during her lunch break. Endorses decreased energy and exercise tolerance. States she has had a constant headache for about 2 weeks as well associated with blurry vision which she attributed to working in front of a computer all day. Headache is frontal, bilateral and described as "band-like," mild in severity. No current headache. Does not wake from sleep.  Also describes episodes of dizziness, lasting several seconds, triggered by bending forward/head movements.  She occasionally has chest discomfort that she attributes to acid reflux. Denies exertional chest pain. She is concerned about her blood pressure.  She does endorse increased emotional stress, especially related to her job. She does not feel accepted by her colleagues and states no one talks to her.  Results of the Epworth flowsheet 01/28/2018  Sitting and reading 2  Watching TV 2  Sitting, inactive in a public place (e.g. a theatre or a meeting) 0  As a passenger in a car for an hour without a break 0  Lying down to rest in the afternoon when circumstances permit 2  Sitting and talking to someone 0  Sitting quietly after a lunch without alcohol 2  In a car, while stopped for a few minutes in traffic 0  Total score 8     Depression screen PHQ 2/9 02/27/2017  Decreased Interest 2  Down, Depressed, Hopeless 2  PHQ - 2 Score 4  Altered sleeping 3  Tired, decreased energy 2  Change in appetite 2  Feeling bad or failure about yourself  0  Trouble concentrating 1  Moving slowly or fidgety/restless 0  Suicidal thoughts 0  PHQ-9 Score 12  Difficult doing work/chores Somewhat difficult    No flowsheet data  found.    Past Medical History:  Diagnosis Date  . Depression   . Hx of adenomatous colonic polyps 05/20/2016  . Influenza   . Inverted nipple    left side  . Lipoma of shoulder   . OA (osteoarthritis)    both knees, right hip  . Obesity    Past Surgical History:  Procedure Laterality Date  . ABDOMINAL HYSTERECTOMY    . ANTERIOR CERVICAL DECOMP/DISCECTOMY FUSION  1999  . BREAST EXCISIONAL BIOPSY Right   . BREAST LUMPECTOMY  1984  . BREAST REDUCTION SURGERY Bilateral 1996  . CESAREAN SECTION    . CHOLECYSTECTOMY    . COLONOSCOPY    . DILATION AND CURETTAGE OF UTERUS  1982  . GASTRIC BYPASS  2000  . REDUCTION MAMMAPLASTY    . TONSILLECTOMY  1973  . WRIST FRACTURE SURGERY Right 2014   Social History   Tobacco Use  . Smoking status: Former Smoker    Last attempt to quit: 11/09/1985    Years since quitting: 32.2  . Smokeless tobacco: Never Used  Substance Use Topics  . Alcohol use: Yes    Alcohol/week: 0.0 standard drinks    Comment: 1-2 glasses per week (  09/21/14)   family history includes Alcohol abuse in her brother and sister; Cancer in her father, maternal aunt, maternal aunt, mother, and paternal aunt; Cancer (age of onset: 63) in her sister; Colon cancer in her cousin, maternal aunt, maternal aunt, and another family member; Diabetes in an other family member; Hypertension in an other family member; Lung cancer in her mother.    ROS: Review of Systems  Constitutional: Positive for malaise/fatigue.  HENT: Positive for hearing loss (right, chronic) and tinnitus (chronic).   Eyes: Positive for blurred vision. Negative for double vision and photophobia.  Respiratory: Negative for shortness of breath.   Cardiovascular: Positive for chest pain.  Gastrointestinal: Positive for heartburn. Negative for nausea and vomiting.  Musculoskeletal: Negative for falls.  Neurological: Positive for dizziness and headaches. Negative for loss of consciousness.   Psychiatric/Behavioral: Positive for depression.     Medications: Current Outpatient Medications  Medication Sig Dispense Refill  . acetaminophen (TYLENOL) 650 MG CR tablet Take 2 tablets (1,300 mg total) by mouth every 8 (eight) hours as needed for pain. 90 tablet 3  . diclofenac sodium (VOLTAREN) 1 % GEL Apply 2 g topically 4 (four) times daily. To affected joint. 100 g 11  . Multiple Vitamin (MULTIVITAMIN) tablet Take 1 tablet by mouth daily.    Marland Kitchen omeprazole (PRILOSEC) 40 MG capsule Take 1 capsule (40 mg total) by mouth daily. 90 capsule 3  . traZODone (DESYREL) 50 MG tablet Take 0.5-1 tablets (25-50 mg total) by mouth at bedtime as needed for sleep. 30 tablet 3   No current facility-administered medications for this visit.    Allergies  Allergen Reactions  . Adhesive [Tape]     Skin blisters  . Morphine And Related   . Sulfa Antibiotics Rash       Objective:  BP 129/67 (BP Location: Left Arm, Patient Position: Sitting, Cuff Size: Large)   Pulse (!) 53   Temp 97.9 F (36.6 C)   Wt 196 lb (88.9 kg)   SpO2 99%   BMI 33.64 kg/m  Gen: well-groomed, not ill-appearing, no acute distress, obese female HEENT: head normocephalic, atraumatic; conjunctiva and cornea clear, oropharynx clear, moist mucus membranes; neck supple, no meningeal signs Pulm: Normal work of breathing, normal phonation, clear to auscultation bilaterally CV: bradycardic rate, regular rhythm, s1 and s2 distinct, no murmurs, clicks or rubs Neuro:  cranial nerves II-XII intact, no nystagmus, normal finger-to-nose, normal heel-to-shin, negative pronator drift, normal rapid alternating movements, normal tone, no tremor, Dix Hallpike maneuver negative for nystagmus but reproduces symptoms bilaterally MSK: strength 5/5 and symmetric in bilateral upper and lower extremities, normal gait and station, negative Romberg Mental Status: alert and oriented x 3, speech articulate, and thought processes clear and  goal-directed    BP Readings from Last 3 Encounters:  01/28/18 129/67  01/21/18 (!) 135/51  12/24/17 137/63   Orthostatic VS for the past 24 hrs (Last 3 readings):  BP- Lying Pulse- Lying BP- Sitting Pulse- Sitting BP- Standing at 0 minutes Pulse- Standing at 0 minutes  01/28/18 1543 132/83 62 138/78 54 130/80 79     No results found for this or any previous visit (from the past 72 hour(s)). No results found.    Assessment and Plan: 63 y.o. female with   .Loraine was seen today for dizziness.  Diagnoses and all orders for this visit:  Decreased diastolic blood pressure -     ECHOCARDIOGRAM COMPLETE  Bradycardia -     ECHOCARDIOGRAM COMPLETE  Atypical chest pain -  ECHOCARDIOGRAM COMPLETE  Excessive daytime sleepiness -     Home sleep test; Future  Vertigo, benign paroxysmal, bilateral -     Ambulatory referral to Physical Therapy  H/O gastric bypass -     CBC with Differential/Platelet -     COMPLETE METABOLIC PANEL WITH GFR -     Hemoglobin A1c -     B12 and Folate Panel -     VITAMIN D 25 Hydroxy (Vit-D Deficiency, Fractures) -     Vitamin A -     Vitamin B1 -     Fe+TIBC+Fer  Fatigue, unspecified type -     CBC with Differential/Platelet -     COMPLETE METABOLIC PANEL WITH GFR -     Hemoglobin A1c -     B12 and Folate Panel -     VITAMIN D 25 Hydroxy (Vit-D Deficiency, Fractures) -     Vitamin A -     Vitamin B1 -     Fe+TIBC+Fer -     TSH + free T4 -     C-reactive protein -     Sedimentation rate  Screening for thyroid disorder -     TSH + free T4  Vitamin deficiency -     B12 and Folate Panel -     VITAMIN D 25 Hydroxy (Vit-D Deficiency, Fractures) -     Vitamin A -     Vitamin B1 -     Fe+TIBC+Fer  History of diabetes mellitus, type II -     Hemoglobin A1c  Frontal headache -     C-reactive protein -     Sedimentation rate  At risk for obstructive sleep apnea -     Home sleep test; Future    Fatigue Likely multifactorial  (postmenopausal, vitamin deficiency, anxiety/stress) +STOP BANG=3: + hypersomnolence, ESS=8 Recommend sleep study given high Epworth score and risk factors Labs including TSH, iron, and vitamin D pending Will also obtain echocardiogram due to change in exercise tolerance and wide pulse pressure  Dizziness Episodic, positional/related to head movements, lasting several seconds, resolving spontaneously Reassuring neuro exam, no orthostasis, Marye Round reproduces symptoms Referral placed for vestibular rehab  Patient education and anticipatory guidance given Patient agrees with treatment plan Follow-up in 2 weeks or sooner as needed if symptoms worsen or fail to improve  I spent 40 minutes with this patient, greater than 50% was face-to-face time counseling regarding the above diagnoses  Darlyne Russian PA-C

## 2018-01-30 ENCOUNTER — Telehealth: Payer: Self-pay

## 2018-02-02 LAB — HEMOGLOBIN A1C
Hgb A1c MFr Bld: 5.9 % of total Hgb — ABNORMAL HIGH (ref <5.7)
Mean Plasma Glucose: 123 (calc)
eAG (mmol/L): 6.8 (calc)

## 2018-02-02 LAB — CBC WITH DIFFERENTIAL/PLATELET
Absolute Monocytes: 356 cells/uL (ref 200–950)
Basophils Absolute: 41 cells/uL (ref 0–200)
Basophils Relative: 0.9 %
Eosinophils Absolute: 81 cells/uL (ref 15–500)
Eosinophils Relative: 1.8 %
HCT: 44.2 % (ref 35.0–45.0)
Hemoglobin: 14.3 g/dL (ref 11.7–15.5)
Lymphs Abs: 1238 cells/uL (ref 850–3900)
MCH: 27 pg (ref 27.0–33.0)
MCHC: 32.4 g/dL (ref 32.0–36.0)
MCV: 83.6 fL (ref 80.0–100.0)
MPV: 11.4 fL (ref 7.5–12.5)
Monocytes Relative: 7.9 %
Neutro Abs: 2786 cells/uL (ref 1500–7800)
Neutrophils Relative %: 61.9 %
Platelets: 192 10*3/uL (ref 140–400)
RBC: 5.29 10*6/uL — ABNORMAL HIGH (ref 3.80–5.10)
RDW: 14 % (ref 11.0–15.0)
Total Lymphocyte: 27.5 %
WBC: 4.5 10*3/uL (ref 3.8–10.8)

## 2018-02-02 LAB — B12 AND FOLATE PANEL
Folate: >24 ng/mL
VITAMIN B 12: 327 pg/mL (ref 200–1100)

## 2018-02-02 LAB — COMPLETE METABOLIC PANEL WITH GFR
AG Ratio: 1.9 (calc) (ref 1.0–2.5)
ALT: 21 U/L (ref 6–29)
AST: 26 U/L (ref 10–35)
Albumin: 3.9 g/dL (ref 3.6–5.1)
Alkaline phosphatase (APISO): 88 U/L (ref 33–130)
BUN: 14 mg/dL (ref 7–25)
CHLORIDE: 106 mmol/L (ref 98–110)
CO2: 29 mmol/L (ref 20–32)
Calcium: 9.3 mg/dL (ref 8.6–10.4)
Creat: 0.95 mg/dL (ref 0.50–0.99)
GFR, Est African American: 75 mL/min/{1.73_m2} (ref >=60)
GFR, Est Non African American: 65 mL/min/{1.73_m2} (ref >=60)
Globulin: 2.1 g/dL (calc) (ref 1.9–3.7)
Glucose, Bld: 82 mg/dL (ref 65–99)
Potassium: 5.5 mmol/L — ABNORMAL HIGH (ref 3.5–5.3)
Sodium: 143 mmol/L (ref 135–146)
Total Bilirubin: 0.3 mg/dL (ref 0.2–1.2)
Total Protein: 6 g/dL — ABNORMAL LOW (ref 6.1–8.1)

## 2018-02-02 LAB — VITAMIN A: Vitamin A (Retinoic Acid): 41 ug/dL (ref 38–98)

## 2018-02-02 LAB — C-REACTIVE PROTEIN: CRP: 0.8 mg/L (ref <8.0)

## 2018-02-02 LAB — IRON,TIBC AND FERRITIN PANEL
%SAT: 19 % (calc) (ref 16–45)
Ferritin: 8 ng/mL — ABNORMAL LOW (ref 16–232)
Iron: 78 ug/dL (ref 45–160)
TIBC: 413 mcg/dL (calc) (ref 250–450)

## 2018-02-02 LAB — TSH+FREE T4: TSH W/REFLEX TO FT4: 2.88 m[IU]/L (ref 0.40–4.50)

## 2018-02-02 LAB — VITAMIN D 25 HYDROXY (VIT D DEFICIENCY, FRACTURES): Vit D, 25-Hydroxy: 28 ng/mL — ABNORMAL LOW (ref 30–100)

## 2018-02-02 LAB — VITAMIN B1: Vitamin B1 (Thiamine): 10 nmol/L (ref 8–30)

## 2018-02-02 LAB — SEDIMENTATION RATE: Sed Rate: 6 mm/h (ref 0–30)

## 2018-02-03 ENCOUNTER — Encounter: Payer: Self-pay | Admitting: Physician Assistant

## 2018-02-03 DIAGNOSIS — G4719 Other hypersomnia: Secondary | ICD-10-CM | POA: Insufficient documentation

## 2018-02-03 DIAGNOSIS — H8113 Benign paroxysmal vertigo, bilateral: Secondary | ICD-10-CM | POA: Insufficient documentation

## 2018-02-03 DIAGNOSIS — Z9189 Other specified personal risk factors, not elsewhere classified: Secondary | ICD-10-CM | POA: Insufficient documentation

## 2018-02-03 DIAGNOSIS — R001 Bradycardia, unspecified: Secondary | ICD-10-CM | POA: Insufficient documentation

## 2018-02-03 DIAGNOSIS — R519 Headache, unspecified: Secondary | ICD-10-CM | POA: Insufficient documentation

## 2018-02-03 DIAGNOSIS — R0989 Other specified symptoms and signs involving the circulatory and respiratory systems: Secondary | ICD-10-CM | POA: Insufficient documentation

## 2018-02-03 DIAGNOSIS — R031 Nonspecific low blood-pressure reading: Secondary | ICD-10-CM | POA: Insufficient documentation

## 2018-02-03 DIAGNOSIS — R0789 Other chest pain: Secondary | ICD-10-CM | POA: Insufficient documentation

## 2018-02-03 DIAGNOSIS — R51 Headache: Secondary | ICD-10-CM

## 2018-02-05 ENCOUNTER — Encounter: Payer: Self-pay | Admitting: Physician Assistant

## 2018-02-05 DIAGNOSIS — E559 Vitamin D deficiency, unspecified: Secondary | ICD-10-CM | POA: Insufficient documentation

## 2018-02-05 DIAGNOSIS — R790 Abnormal level of blood mineral: Secondary | ICD-10-CM | POA: Insufficient documentation

## 2018-02-06 ENCOUNTER — Ambulatory Visit (HOSPITAL_COMMUNITY)
Admission: RE | Admit: 2018-02-06 | Discharge: 2018-02-06 | Disposition: A | Discharge status: Home / Self Care | Payer: BC Managed Care – PPO | Source: Ambulatory Visit | Attending: Physician Assistant | Admitting: Physician Assistant

## 2018-02-06 DIAGNOSIS — R001 Bradycardia, unspecified: Secondary | ICD-10-CM | POA: Diagnosis not present

## 2018-02-06 DIAGNOSIS — R079 Chest pain, unspecified: Secondary | ICD-10-CM | POA: Insufficient documentation

## 2018-02-06 DIAGNOSIS — R0989 Other specified symptoms and signs involving the circulatory and respiratory systems: Secondary | ICD-10-CM | POA: Insufficient documentation

## 2018-02-06 DIAGNOSIS — R0789 Other chest pain: Secondary | ICD-10-CM | POA: Diagnosis not present

## 2018-02-06 NOTE — Progress Notes (Signed)
  Echocardiogram 2D Echocardiogram has been performed.  Julia Larson 02/06/2018, 1:24 PM

## 2018-02-07 ENCOUNTER — Other Ambulatory Visit (HOSPITAL_BASED_OUTPATIENT_CLINIC_OR_DEPARTMENT_OTHER): Payer: BC Managed Care – PPO

## 2018-02-11 ENCOUNTER — Other Ambulatory Visit: Payer: Self-pay

## 2018-02-11 ENCOUNTER — Encounter: Payer: Self-pay | Admitting: Physician Assistant

## 2018-02-11 ENCOUNTER — Ambulatory Visit: Payer: BC Managed Care – PPO | Admitting: Physician Assistant

## 2018-02-11 ENCOUNTER — Encounter: Payer: Self-pay | Admitting: Physical Therapy

## 2018-02-11 ENCOUNTER — Ambulatory Visit: Payer: BC Managed Care – PPO | Admitting: Physical Therapy

## 2018-02-11 VITALS — BP 100/60 | HR 61 | Ht 64.0 in | Wt 195.0 lb

## 2018-02-11 DIAGNOSIS — G478 Other sleep disorders: Secondary | ICD-10-CM | POA: Diagnosis not present

## 2018-02-11 DIAGNOSIS — E875 Hyperkalemia: Secondary | ICD-10-CM

## 2018-02-11 DIAGNOSIS — F329 Major depressive disorder, single episode, unspecified: Secondary | ICD-10-CM

## 2018-02-11 DIAGNOSIS — E6609 Other obesity due to excess calories: Secondary | ICD-10-CM | POA: Diagnosis not present

## 2018-02-11 DIAGNOSIS — Z6833 Body mass index (BMI) 33.0-33.9, adult: Secondary | ICD-10-CM

## 2018-02-11 DIAGNOSIS — F32A Depression, unspecified: Secondary | ICD-10-CM

## 2018-02-11 DIAGNOSIS — R42 Dizziness and giddiness: Secondary | ICD-10-CM | POA: Diagnosis not present

## 2018-02-11 DIAGNOSIS — Z9189 Other specified personal risk factors, not elsewhere classified: Secondary | ICD-10-CM

## 2018-02-11 DIAGNOSIS — H8113 Benign paroxysmal vertigo, bilateral: Secondary | ICD-10-CM

## 2018-02-11 DIAGNOSIS — R0989 Other specified symptoms and signs involving the circulatory and respiratory systems: Secondary | ICD-10-CM

## 2018-02-11 DIAGNOSIS — G4719 Other hypersomnia: Secondary | ICD-10-CM

## 2018-02-11 LAB — POTASSIUM: Potassium: 5.2 mmol/L (ref 3.5–5.3)

## 2018-02-11 NOTE — Patient Instructions (Signed)
Stress care at [facility].  Will order sleep test.   Hypotension As your heart beats, it forces blood through your body. This force is called blood pressure. If you have hypotension, you have low blood pressure. When your blood pressure is too low, you may not get enough blood to your brain or other parts of your body. This may cause you to feel weak, light-headed, have a fast heartbeat, or even pass out (faint). Low blood pressure may be harmless, or it may cause serious problems. What are the causes?  Blood loss.  Not enough water in the body (dehydration).  Heart problems.  Hormone problems.  Pregnancy.  A very bad infection.  Not having enough of certain nutrients.  Very bad allergic reactions.  Certain medicines. What increases the risk?  Age. The risk increases as you get older.  Conditions that affect the heart or the brain and spinal cord (central nervous system).  Taking certain medicines.  Being pregnant. What are the signs or symptoms?  Feeling: ? Weak. ? Light-headed. ? Dizzy. ? Tired (fatigued).  Blurred vision.  Fast heartbeat.  Passing out, in very bad cases. How is this treated?  Changing your diet. This may involve eating more salt (sodium) or drinking more water.  Taking medicines to raise your blood pressure.  Changing how much you take (the dosage) of some of your medicines.  Wearing compression stockings. These stockings help to prevent blood clots and reduce swelling in your legs. In some cases, you may need to go to the hospital for:  Fluid replacement. This means you will receive fluids through an IV tube.  Blood replacement. This means you will receive donated blood through an IV tube (transfusion).  Treating an infection or heart problems, if this applies.  Monitoring. You may need to be monitored while medicines that you are taking wear off. Follow these instructions at home: Eating and drinking   Drink enough  fluids to keep your pee (urine) pale yellow.  Eat a healthy diet. Follow instructions from your doctor about what you can eat or drink. A healthy diet includes: ? Fresh fruits and vegetables. ? Whole grains. ? Low-fat (lean) meats. ? Low-fat dairy products.  Eat extra salt only as told. Do not add extra salt to your diet unless your doctor tells you to.  Eat small meals often.  Avoid standing up quickly after you eat. Medicines  Take over-the-counter and prescription medicines only as told by your doctor. ? Follow instructions from your doctor about changing how much you take of your medicines, if this applies. ? Do not stop or change any of your medicines on your own. General instructions   Wear compression stockings as told by your doctor.  Get up slowly from lying down or sitting.  Avoid hot showers and a lot of heat as told by your doctor.  Return to your normal activities as told by your doctor. Ask what activities are safe for you.  Do not use any products that contain nicotine or tobacco, such as cigarettes, e-cigarettes, and chewing tobacco. If you need help quitting, ask your doctor.  Keep all follow-up visits as told by your doctor. This is important. Contact a doctor if:  You throw up (vomit).  You have watery poop (diarrhea).  You have a fever for more than 2-3 days.  You feel more thirsty than normal.  You feel weak and tired. Get help right away if:  You have chest pain.  You  have a fast or uneven heartbeat.  You lose feeling (have numbness) in any part of your body.  You cannot move your arms or your legs.  You have trouble talking.  You get sweaty or feel light-headed.  You pass out.  You have trouble breathing.  You have trouble staying awake.  You feel mixed up (confused). Summary  Hypotension is also called low blood pressure. It is when the force of blood pumping through your arteries is too weak.  Hypotension may be harmless, or  it may cause serious problems.  Treatment may include changing your diet and medicines, and wearing compression stockings.  In very bad cases, you may need to go to the hospital. This information is not intended to replace advice given to you by your health care provider. Make sure you discuss any questions you have with your health care provider. Document Released: 03/22/2009 Document Revised: 06/21/2017 Document Reviewed: 06/21/2017 Elsevier Interactive Patient Education  [employer].

## 2018-02-11 NOTE — Therapy (Signed)
[facility] [place], [place], [address] Phone: [phone]   Fax:  [phone]  Physical Therapy Evaluation  Patient Details  Name: Julia Larson MRN: [id number] Date of Birth: 08-13-1955 Referring Provider (PT): Trixie Dredge, [place]   Encounter Date: 02/11/2018  PT End of Session - 02/11/18 1343    Visit Number  1    Number of Visits  6    Date for PT Re-Evaluation  03/25/18    PT Start Time  1147    PT Stop Time  1225    PT Time Calculation (min)  38 min    Activity Tolerance  Treatment limited secondary to medical complications (Comment)    Behavior During Therapy  [facility] for tasks assessed/performed       Past Medical History:  Diagnosis Date  . Depression   . Hx of adenomatous colonic polyps 05/20/2016  . Influenza   . Inverted nipple    left side  . Lipoma of shoulder   . OA (osteoarthritis)    both knees, right hip  . Obesity     Past Surgical History:  Procedure Laterality Date  . ABDOMINAL HYSTERECTOMY    . ANTERIOR CERVICAL DECOMP/DISCECTOMY FUSION  1999  . BREAST EXCISIONAL BIOPSY Right   . BREAST LUMPECTOMY  1984  . BREAST REDUCTION SURGERY Bilateral 1996  . CESAREAN SECTION    . CHOLECYSTECTOMY    . COLONOSCOPY    . DILATION AND CURETTAGE OF UTERUS  1982  . GASTRIC BYPASS  2000  . REDUCTION MAMMAPLASTY    . TONSILLECTOMY  1973  . WRIST FRACTURE SURGERY Right 2014    There were no vitals filed for this visit.   Subjective Assessment - 02/11/18 1147    Subjective  Pt is a 63 y/o female who presents to OPPT for vertigo.  Pt reports symptoms began 2 weeks ago provoked with positional movements.      Patient Stated Goals  improve dizziness    Currently in Pain?  No/denies         [facility] PT Assessment - 02/11/18 1148      Assessment   Medical Diagnosis  H81.13 (ICD-10-CM) - Vertigo, benign paroxysmal, bilateral    Referring Provider (PT)  Trixie Dredge, PA-C    Onset Date/Surgical Date  01/28/18    Next MD Visit  PRN      Precautions   Precautions  None      Restrictions   Weight Bearing Restrictions  No      Balance Screen   Has the patient fallen in the past 6 months  No    Has the patient had a decrease in activity level because of a fear of falling?   No    Is the patient reluctant to leave their home because of a fear of falling?   No      Home Environment   Living Environment  Private residence    Living Arrangements  Alone    Type of [place]    Additional Comments  reports using handrail with stairs      Prior Function   Level of Independence  Independent    Vocation  Full time employment    Licensed conveyancer at [employer], hiking, daily swimming, bowling; walking      Cognition   Overall Cognitive Status  Within Functional Limits for tasks assessed  Vestibular Assessment - 02/11/18 1152      Vestibular Assessment   General Observation  reports symptoms at rest 3/10      Symptom Behavior   Type of Dizziness  Imbalance   spinning   Frequency of Dizziness  2-3 times/day    Duration of Dizziness  seconds to minutes    Aggravating Factors  Turning head sideways;Turning head quickly   ?low glucose   Relieving Factors  No known relieving factors      Occulomotor Exam   Occulomotor Alignment  Normal    Spontaneous  Absent    Gaze-induced  Absent    Smooth Pursuits  Intact    Saccades  Intact      Vestibulo-Occular Reflex   VOR 1 Head Only (x 1 viewing)  WNL with mild increase in symptoms    Comment  HIT (-)      Positional Testing   Dix-Hallpike  Dix-Hallpike Right;Dix-Hallpike Left    Horizontal Canal Testing  Horizontal Canal Right;Horizontal Canal Left      Dix-Hallpike Right   Dix-Hallpike Right Duration  12 sec    Dix-Hallpike Right Symptoms  No nystagmus      Dix-Hallpike Left   Dix-Hallpike Left Duration  6 sec     Dix-Hallpike Left Symptoms  No nystagmus      Horizontal Canal Right   Horizontal Canal Right Duration  none    Horizontal Canal Right Symptoms  Normal      Horizontal Canal Left   Horizontal Canal Left Duration  none    Horizontal Canal Left Symptoms  Normal          Objective measurements completed on examination: See above findings.       Vestibular Treatment/Exercise - 02/11/18 1341      Vestibular Treatment/Exercise   Vestibular Treatment Provided  Canalith Repositioning;Gaze    Canalith Repositioning  Epley Manuever Right    Gaze Exercises  X1 Viewing Horizontal;X1 Viewing Vertical       EPLEY MANUEVER RIGHT   Number of Reps   2    Overall Response  No change    Response Details   pt reported symptoms "about the same"      X1 Viewing Horizontal   Foot Position  seated    Time  --   30 sec   Reps  1      X1 Viewing Vertical   Foot Position  seated    Time  --   30 sec   Reps  1            PT Education - 02/11/18 1343    Education Details  HEP, BPPV    Person(s) Educated  Patient    Methods  Demonstration;Handout;Explanation    Comprehension  Verbalized understanding;Returned demonstration;Need further instruction          PT Long Term Goals - 02/11/18 1346      PT LONG TERM GOAL #1   Title  to be determined if pt returns             Plan - 02/11/18 1343    Clinical Impression Statement  Pt is a 63 y/o female who presents to OPPT for vertigo.  Pt with subjective reports consistent with BPPV, however unable to reproduce nystagmus today.  Pt with mild symptoms during positional testing so treated with 2 reps of Rt epley's today, without reports of change in symptoms.  Pt likely with decreased vestibular input following BPPV that now appears  to be resolved so provided gaze stabilization today.  Pt will return to PT if symptoms develop again.      Clinical Presentation  Stable    Clinical Decision Making  Low    Rehab Potential  Good     PT Frequency  1x / week    PT Duration  6 weeks    PT Treatment/Interventions  ADLs/Self Care Home Management;Canalith Repostioning;Gait training;Stair training;Functional mobility training;Neuromuscular re-education;Therapeutic exercise;Therapeutic activities;Balance training;Vestibular;Patient/family education    PT Next Visit Plan  reassess if pt returns    PT Home Exercise Plan  Access Code: [email or web address]     Consulted and Agree with Plan of Care  Patient       Patient will benefit from skilled therapeutic intervention in order to improve the following deficits and impairments:  Dizziness  Visit Diagnosis: Dizziness and giddiness - Plan: PT plan of care cert/re-cert     Problem List Patient Active Problem List   Diagnosis Date Noted  . Vitamin D insufficiency 02/05/2018  . Low serum ferritin level 02/05/2018  . Decreased diastolic blood pressure 54/09/8117  . Bradycardia 02/03/2018  . Atypical chest pain 02/03/2018  . Excessive daytime sleepiness 02/03/2018  . Vertigo, benign paroxysmal, bilateral 02/03/2018  . At risk for obstructive sleep apnea 02/03/2018  . Frontal headache 02/03/2018  . Achilles tendinitis of right lower extremity 08/29/2017  . Stress at work 04/04/2017  . Family history of stomach cancer 03/01/2017  . Family history of colon cancer 03/01/2017  . Primary insomnia 03/01/2017  . Anxiety disorder 03/01/2017  . Gastroesophageal reflux disease with esophagitis 03/01/2017  . Hx of adenomatous colonic polyps 05/20/2016  . Obesity 08/04/2015  . Depression 08/04/2015  . Actinic keratosis due to exposure to sunlight 10/16/2014  . Low iron stores 10/16/2014  . Lumbar radiculopathy 09/21/2014  . RLS (restless legs syndrome) 09/21/2014  . Fatigue 09/21/2014  . Nocturnal muscle cramps 05/28/2014  . Primary osteoarthritis of both knees 02/17/2014  . Lipoma of shoulder 02/17/2014  . Primary osteoarthritis of right hip 01/26/2014  . H/O gastric bypass 01/21/2014  .  History of diabetes mellitus 01/21/2014      Laureen Abrahams, PT, DPT 02/11/18 1:48 PM     [facility] [place], [place], [address] Phone: [phone]   Fax:  [phone]  Name: Julia Larson MRN: [id number] Date of Birth: May 17, 1955

## 2018-02-11 NOTE — Patient Instructions (Signed)
Access Code: [email or web address]  URL: [email or web address]  Date: 02/11/2018  Prepared by: Faustino Congress   Exercises  Seated Gaze Stabilization with Head Rotation - 1 sets - 30 seconds - 2x daily - 7x weekly  Seated Gaze Stabilization with Head Nod - 1 sets - 30 Seconds - 2x daily - 7x weekly  Patient Education  Gaze Stabilization

## 2018-02-11 NOTE — Progress Notes (Signed)
Subjective:    Patient ID: Julia Larson, female    DOB: [birth year], 63 y.o.   MRN: [id number]  HPI  Pt is a 63 yo female who presents to the clinic to follow up on hyperkalemia, fatigue, and dizziness.   She was seen by Evlyn Clines on 1/20. Echo, sleep study, PT for dizziness was ordered.   Echo normal, labs essentially normal except for elevated potassium.   Dizziness is better but still occasionally has some symptoms with position changes. Not doing epley. Has PT appt today.   Pt is concerned about low bottom number of blood pressure.she has always had low blood pressure.   She admits to her mood being down. She usually is more depressed during the winter, she hates her job, she feels like she does not have any real friends. No SI/HC.   Active Ambulatory Problems    Diagnosis Date Noted  . H/O gastric bypass 01/21/2014  . History of diabetes mellitus 01/21/2014  . Primary osteoarthritis of right hip 01/26/2014  . Primary osteoarthritis of both knees 02/17/2014  . Lipoma of shoulder 02/17/2014  . Nocturnal muscle cramps 05/28/2014  . Lumbar radiculopathy 09/21/2014  . RLS (restless legs syndrome) 09/21/2014  . Fatigue 09/21/2014  . Actinic keratosis due to exposure to sunlight 10/16/2014  . Low iron stores 10/16/2014  . Obesity 08/04/2015  . Depression 08/04/2015  . Hx of adenomatous colonic polyps 05/20/2016  . Family history of stomach cancer 03/01/2017  . Family history of colon cancer 03/01/2017  . Primary insomnia 03/01/2017  . Anxiety disorder 03/01/2017  . Gastroesophageal reflux disease with esophagitis 03/01/2017  . Stress at work 04/04/2017  . Achilles tendinitis of right lower extremity 08/29/2017  . Decreased diastolic blood pressure 01/74/9449  . Bradycardia 02/03/2018  . Atypical chest pain 02/03/2018  . Excessive daytime sleepiness 02/03/2018  . Vertigo, benign paroxysmal, bilateral 02/03/2018  . At risk for obstructive sleep apnea 02/03/2018  . Frontal  headache 02/03/2018  . Vitamin D insufficiency 02/05/2018  . Low serum ferritin level 02/05/2018   Resolved Ambulatory Problems    Diagnosis Date Noted  . Shingles outbreak 01/21/2014  . De Quervain's tenosynovitis, right 01/21/2014  . Mass of skin of right shoulder 01/21/2014  . Right wrist pain 01/26/2014  . Right leg pain 01/26/2014  . Solar lentigo 10/16/2014  . Breast pain, left 10/22/2014  . Cough 03/01/2017  . Class 1 obesity due to excess calories without serious comorbidity with body mass index (BMI) of 33.0 to 33.9 in adult 03/01/2017  . Grief reaction 03/01/2017  . Lipoma of right shoulder 03/01/2017   Past Medical History:  Diagnosis Date  . Influenza   . Inverted nipple   . OA (osteoarthritis)      .    Review of Systems  All other systems reviewed and are negative.      Objective:   Physical Exam Vitals signs reviewed.  Constitutional:      Appearance: Normal appearance. She is obese.  HENT:     Head: Normocephalic and atraumatic.  Cardiovascular:     Rate and Rhythm: Normal rate and regular rhythm.  Pulmonary:     Effort: Pulmonary effort is normal.     Breath sounds: Normal breath sounds.  Neurological:     General: No focal deficit present.     Mental Status: She is alert and oriented to person, place, and time.  Psychiatric:        Mood and Affect: Mood normal.  Behavior: Behavior normal.           Assessment & Plan:  Marland KitchenMarland KitchenTammy was seen today for follow-up.  Diagnoses and all orders for this visit:  Non-restorative sleep -     Home sleep test  Class 1 obesity due to excess calories without serious comorbidity with body mass index (BMI) of 33.0 to 33.9 in adult -     Home sleep test  Hyperkalemia -     Potassium  Vertigo, benign paroxysmal, bilateral  At risk for obstructive sleep apnea  Excessive daytime sleepiness  Depression, unspecified depression type  Decreased diastolic blood pressure   Reordered home sleep  test since not been called.  Reordered potassium to have rechecked.   Pt has first PT appt today. Hopefully that will knock out dizziness. Consider doing epley manuevers at home. Follow up if not improving.   Discussed mood. Consider lizzy [employer] stress relief formula. Pt declines any medication intervention. She is looking for new groups to attach to and form relationships. She has met a guy online and going to get coffee this week.   BP is low and has been. For now increase salt and hydration. If continues to be low could consider a medication to help elevate it.   Marland Kitchen.Spent 30 minutes with patient and greater than 50 percent of visit spent counseling patient regarding treatment plan.

## 2018-02-12 NOTE — Progress Notes (Signed)
Call pt: potassium is in normal range but on the upper limit of normal. I would take a look at potassium rich foods and just make sure you limit them not eating in excess. Do NOT take any extra potassium in vitamins etc.

## 2018-02-20 ENCOUNTER — Telehealth: Payer: Self-pay

## 2018-02-20 NOTE — Telephone Encounter (Signed)
Terry from sleep center at [facility] left VM to let Julia Larson know that the home sleep test was denied, but insurance states there is no pre-auth needed for in-lab sleep study. If you want pt to have this done, order needs to be changed.   Please advise.

## 2018-02-20 NOTE — Telephone Encounter (Signed)
Find out if patient is ok with lab test?

## 2018-02-25 NOTE — Telephone Encounter (Signed)
Pt declines sleep study at this.. states she contacted insurance and her portion was going to be too much.   She will be contacting insurance and discussing this, then will call us back when he is ready to proceed.   No further needs a this time.

## 2018-02-27 NOTE — Telephone Encounter (Signed)
Error

## 2018-03-05 ENCOUNTER — Telehealth: Payer: Self-pay | Admitting: Physician Assistant

## 2018-03-05 NOTE — Telephone Encounter (Signed)
Patient called and stated that she is having UTI symptoms and is going out of town. She does not want to come in for an office visit and is requesting something for the UTI. She reports that she gets them frequently. Please advise.

## 2018-03-06 ENCOUNTER — Encounter: Payer: Self-pay | Admitting: Physician Assistant

## 2018-03-06 ENCOUNTER — Ambulatory Visit (INDEPENDENT_AMBULATORY_CARE_PROVIDER_SITE_OTHER): Payer: BC Managed Care – PPO | Admitting: Physician Assistant

## 2018-03-06 VITALS — BP 117/62 | HR 55 | Temp 98.3°F | Wt 199.0 lb

## 2018-03-06 DIAGNOSIS — R300 Dysuria: Secondary | ICD-10-CM

## 2018-03-06 DIAGNOSIS — N3001 Acute cystitis with hematuria: Secondary | ICD-10-CM | POA: Diagnosis not present

## 2018-03-06 MED ORDER — NITROFURANTOIN MONOHYD MACRO 100 MG PO CAPS: 100.0000 mg | ORAL_CAPSULE | Freq: Two times a day (BID) | ORAL | 0 refills | Status: DC

## 2018-03-06 NOTE — Telephone Encounter (Signed)
Unfortunately office protocol is to not give abx unless have office visit OR has been seen recently for similar symptoms. Evisit may treat without urinalysis. If they don't treat they don't charge. Last UTI was 03/2017.   Could try AZO and cranberry juice! Drink loads of water.

## 2018-03-06 NOTE — Progress Notes (Signed)
Subjective:    Patient ID: Julia Larson, female    DOB: [birth year], 63 y.o.   MRN: [id number]  HPI  Carrina presents today to the clinic with UTI symptoms that began on Monday. She endorses dysuria, increase in frequency, odor change to urine, diarrhea and low back pain. She denies any hematuria, nausea, vomiting, fever or abdominal pain. She has been using  azo, cranberry pills and drinking lots of water to try to help with the symptoms. She has not had any relief thus far. Her last UTI was in March of 2019. She has been having some diarrhea lately but states she makes sure to wipe front to back.   .. Active Ambulatory Problems    Diagnosis Date Noted  . H/O gastric bypass 01/21/2014  . History of diabetes mellitus 01/21/2014  . Primary osteoarthritis of right hip 01/26/2014  . Primary osteoarthritis of both knees 02/17/2014  . Lipoma of shoulder 02/17/2014  . Nocturnal muscle cramps 05/28/2014  . Lumbar radiculopathy 09/21/2014  . RLS (restless legs syndrome) 09/21/2014  . Fatigue 09/21/2014  . Actinic keratosis due to exposure to sunlight 10/16/2014  . Low iron stores 10/16/2014  . Obesity 08/04/2015  . Depression 08/04/2015  . Hx of adenomatous colonic polyps 05/20/2016  . Family history of stomach cancer 03/01/2017  . Family history of colon cancer 03/01/2017  . Primary insomnia 03/01/2017  . Anxiety disorder 03/01/2017  . Gastroesophageal reflux disease with esophagitis 03/01/2017  . Stress at work 04/04/2017  . Achilles tendinitis of right lower extremity 08/29/2017  . Decreased diastolic blood pressure 77/41/2878  . Bradycardia 02/03/2018  . Atypical chest pain 02/03/2018  . Excessive daytime sleepiness 02/03/2018  . Vertigo, benign paroxysmal, bilateral 02/03/2018  . At risk for obstructive sleep apnea 02/03/2018  . Frontal headache 02/03/2018  . Vitamin D insufficiency 02/05/2018  . Low serum ferritin level 02/05/2018   Resolved Ambulatory Problems    Diagnosis  Date Noted  . Shingles outbreak 01/21/2014  . De Quervain's tenosynovitis, right 01/21/2014  . Mass of skin of right shoulder 01/21/2014  . Right wrist pain 01/26/2014  . Right leg pain 01/26/2014  . Solar lentigo 10/16/2014  . Breast pain, left 10/22/2014  . Cough 03/01/2017  . Class 1 obesity due to excess calories without serious comorbidity with body mass index (BMI) of 33.0 to 33.9 in adult 03/01/2017  . Grief reaction 03/01/2017  . Lipoma of right shoulder 03/01/2017   Past Medical History:  Diagnosis Date  . Influenza   . Inverted nipple   . OA (osteoarthritis)      Review of Systems See HPI    Objective:   Physical Exam Constitutional:      Appearance: Normal appearance.  HENT:     Head: Normocephalic.     Nose: Nose normal.     Mouth/Throat:     Pharynx: Oropharynx is clear.  Cardiovascular:     Rate and Rhythm: Normal rate and regular rhythm.     Heart sounds: Normal heart sounds.  Pulmonary:     Breath sounds: Normal breath sounds.  Abdominal:     General: Bowel sounds are normal. There is no distension.     Palpations: Abdomen is soft.     Tenderness: There is no abdominal tenderness. There is no right CVA tenderness or left CVA tenderness.  Genitourinary:    Vagina: No vaginal discharge.  Skin:    General: Skin is warm.     Capillary Refill: Capillary refill takes less than  2 seconds.  Neurological:     General: No focal deficit present.     Mental Status: She is alert and oriented to person, place, and time.  Psychiatric:        Mood and Affect: Mood normal.        Behavior: Behavior normal.           Assessment & Plan:  Marland KitchenMarland KitchenTammy was seen today for dysuria.  Diagnoses and all orders for this visit:  Acute cystitis with hematuria -     nitrofurantoin, macrocrystal-monohydrate, (MACROBID) 100 MG capsule; Take 1 capsule (100 mg total) by mouth 2 (two) times daily.  Dysuria -     Urine Culture   Pt had been taking azo. No dipstick was  done. Urine culture sent. Treated empirically with macrobid. HO given. Follow up as needed.   Marland KitchenVernetta Honey PA-C, have reviewed and agree with the above documentation in it's entirety.

## 2018-03-06 NOTE — Telephone Encounter (Signed)
I left a VM for the patient if she is not able to come in the office she could use the e-visit or the [facility] if needed. Patient has an appointment with PCP.

## 2018-03-06 NOTE — Patient Instructions (Signed)
Urinary Tract Infection, Adult A urinary tract infection (UTI) is an infection of any part of the urinary tract. The urinary tract includes:  The kidneys.  The ureters.  The bladder.  The urethra. These organs make, store, and get rid of pee (urine) in the body. What are the causes? This is caused by germs (bacteria) in your genital area. These germs grow and cause swelling (inflammation) of your urinary tract. What increases the risk? You are more likely to develop this condition if:  You have a small, thin tube (catheter) to drain pee.  You cannot control when you pee or poop (incontinence).  You are female, and: ? You use these methods to prevent pregnancy: ? A medicine that kills sperm (spermicide). ? A device that blocks sperm (diaphragm). ? You have low levels of a female hormone (estrogen). ? You are pregnant.  You have genes that add to your risk.  You are sexually active.  You take antibiotic medicines.  You have trouble peeing because of: ? A prostate that is bigger than normal, if you are female. ? A blockage in the part of your body that drains pee from the bladder (urethra). ? A kidney stone. ? A nerve condition that affects your bladder (neurogenic bladder). ? Not getting enough to drink. ? Not peeing often enough.  You have other conditions, such as: ? Diabetes. ? A weak disease-fighting system (immune system). ? Sickle cell disease. ? Gout. ? Injury of the spine. What are the signs or symptoms? Symptoms of this condition include:  Needing to pee right away (urgently).  Peeing often.  Peeing small amounts often.  Pain or burning when peeing.  Blood in the pee.  Pee that smells bad or not like normal.  Trouble peeing.  Pee that is cloudy.  Fluid coming from the vagina, if you are female.  Pain in the belly or lower back. Other symptoms include:  Throwing up (vomiting).  No urge to eat.  Feeling mixed up (confused).  Being tired  and grouchy (irritable).  A fever.  Watery poop (diarrhea). How is this treated? This condition may be treated with:  Antibiotic medicine.  Other medicines.  Drinking enough water. Follow these instructions at home:  Medicines  Take over-the-counter and prescription medicines only as told by your doctor.  If you were prescribed an antibiotic medicine, take it as told by your doctor. Do not stop taking it even if you start to feel better. General instructions  Make sure you: ? Pee until your bladder is empty. ? Do not hold pee for a long time. ? Empty your bladder after sex. ? Wipe from front to back after pooping if you are a female. Use each tissue one time when you wipe.  Drink enough fluid to keep your pee pale yellow.  Keep all follow-up visits as told by your doctor. This is important. Contact a doctor if:  You do not get better after 1-2 days.  Your symptoms go away and then come back. Get help right away if:  You have very bad back pain.  You have very bad pain in your lower belly.  You have a fever.  You are sick to your stomach (nauseous).  You are throwing up. Summary  A urinary tract infection (UTI) is an infection of any part of the urinary tract.  This condition is caused by germs in your genital area.  There are many risk factors for a UTI. These include having a small, thin   tube to drain pee and not being able to control when you pee or poop.  Treatment includes antibiotic medicines for germs.  Drink enough fluid to keep your pee pale yellow. This information is not intended to replace advice given to you by your health care provider. Make sure you discuss any questions you have with your health care provider. Document Released: 06/14/2007 Document Revised: 07/05/2017 Document Reviewed: 07/05/2017 Elsevier Interactive Patient Education  2019 Elsevier Inc.  

## 2018-03-08 LAB — URINE CULTURE
MICRO NUMBER:: 246236
SPECIMEN QUALITY:: ADEQUATE

## 2018-03-08 NOTE — Progress Notes (Signed)
Call pt: urine culture did not see bacteria consistent with infection. Did your symptoms resolve?

## 2018-04-12 ENCOUNTER — Telehealth: Payer: Self-pay | Admitting: Neurology

## 2018-04-12 ENCOUNTER — Telehealth: Payer: BC Managed Care – PPO | Admitting: Family

## 2018-04-12 DIAGNOSIS — J302 Other seasonal allergic rhinitis: Secondary | ICD-10-CM | POA: Diagnosis not present

## 2018-04-12 MED ORDER — LEVOCETIRIZINE DIHYDROCHLORIDE 5 MG PO TABS: 5.0000 mg | ORAL_TABLET | Freq: Every evening | ORAL | 0 refills | Status: DC

## 2018-04-12 MED ORDER — FLUTICASONE PROPIONATE 50 MCG/ACT NA SUSP: 2.0000 | Freq: Every day | NASAL | 0 refills | Status: DC

## 2018-04-12 NOTE — Telephone Encounter (Signed)
How long as she been using claritin. At times switching up antihistamines can be helpful like trying allegra. We are trying to avoid any prednisone which could help but if contracted COVID could make much worse. Have you ever been on singulair in the past?

## 2018-04-12 NOTE — Telephone Encounter (Signed)
Perfect

## 2018-04-12 NOTE — Telephone Encounter (Signed)
Received vm from patient.  She states she is having trouble with allergies. She has a scratchy throat and new recent occasional night time nose bleeds. No fevers (she is checking daily). She has been taking daily Claritin and using ocean mist nasal spray. She uses a humidifier at night. She is asking what else she can do to help since symptoms are worsening.  Please advise.

## 2018-04-12 NOTE — Telephone Encounter (Signed)
I called patient to discuss. She has been on Claritin for one week. She has used in the past. Meanwhile, she has done an e-visit with Dutch Quint, FNP (in [place]) and instructed to Hold Claritin and take Xyzal and Flonase PRN. She wants to proceed with this for now. She will call our office back if no better.  [place].

## 2018-04-12 NOTE — Telephone Encounter (Signed)
Discussed with Luvenia Starch, CMA

## 2018-04-12 NOTE — Progress Notes (Signed)
E visit for Allergic Rhinitis We are sorry that you are not feeling well.  Here is how we plan to help!  Based on what you have shared with me it looks like you have Allergic Rhinitis.  Rhinitis is when a reaction occurs that causes nasal congestion, runny nose, sneezing, and itching.  Most types of rhinitis are caused by an inflammation and are associated with symptoms in the eyes ears or throat. There are several types of rhinitis.  The most common are acute rhinitis, which is usually caused by a viral illness, allergic or seasonal rhinitis, and nonallergic or year-round rhinitis.  Nasal allergies occur certain times of the year.  Allergic rhinitis is caused when allergens in the air trigger the release of histamine in the body.  Histamine causes itching, swelling, and fluid to build up in the fragile linings of the nasal passages, sinuses and eyelids.  An itchy nose and clear discharge are common.  I recommend the following over the counter treatments: Xyzal 5 mg take 1 tablet daily  I also would recommend a nasal spray: Flonase 2 sprays into each nostril once daily  I have sent both of these to the pharmacy for you.  HOME CARE:   You can use an over-the-counter saline nasal spray as needed  Avoid areas where there is heavy dust, mites, or molds  Stay indoors on windy days during the pollen season  Keep windows closed in home, at least in bedroom; use air conditioner.  Use high-efficiency house air filter  Keep windows closed in car, turn AC on re-circulate  Avoid playing out with dog during pollen season  GET HELP RIGHT AWAY IF:   If your symptoms do not improve within 10 days  You become short of breath  You develop yellow or green discharge from your nose for over 3 days  You have coughing fits  MAKE SURE YOU:   Understand these instructions  Will watch your condition  Will get help right away if you are not doing well or get worse  Thank you for choosing an  e-visit. Your e-visit answers were reviewed by a board certified advanced clinical practitioner to complete your personal care plan. Depending upon the condition, your plan could have included both over the counter or prescription medications. Please review your pharmacy choice. Be sure that the pharmacy you have chosen is open so that you can pick up your prescription now.  If there is a problem you may message your provider in [place] to have the prescription routed to another pharmacy. Your safety is important to [place]. If you have drug allergies check your prescription carefully.  For the next 24 hours, you can use MyChart to ask questions about today's visit, request a non-urgent call back, or ask for a work or school excuse from your e-visit provider. You will get an email in the next two days asking about your experience. I hope that your e-visit has been valuable and will speed your recovery.

## 2018-05-04 ENCOUNTER — Other Ambulatory Visit: Payer: Self-pay | Admitting: Family

## 2018-05-10 ENCOUNTER — Ambulatory Visit: Payer: BC Managed Care – PPO | Admitting: Sports Medicine

## 2018-05-13 ENCOUNTER — Ambulatory Visit: Payer: BC Managed Care – PPO | Admitting: Family Medicine

## 2018-05-13 ENCOUNTER — Encounter: Payer: Self-pay | Admitting: Family Medicine

## 2018-05-13 VITALS — BP 124/63 | HR 81 | Temp 98.2°F | Wt 205.0 lb

## 2018-05-13 DIAGNOSIS — M25561 Pain in right knee: Secondary | ICD-10-CM | POA: Diagnosis not present

## 2018-05-13 DIAGNOSIS — M7061 Trochanteric bursitis, right hip: Secondary | ICD-10-CM

## 2018-05-13 DIAGNOSIS — M25562 Pain in left knee: Secondary | ICD-10-CM

## 2018-05-13 DIAGNOSIS — M170 Bilateral primary osteoarthritis of knee: Secondary | ICD-10-CM | POA: Diagnosis not present

## 2018-05-13 DIAGNOSIS — M67911 Unspecified disorder of synovium and tendon, right shoulder: Secondary | ICD-10-CM

## 2018-05-13 MED ORDER — DICLOFENAC SODIUM 1 % TD GEL: 4.0000 g | Freq: Four times a day (QID) | TRANSDERMAL | 11 refills | Status: DC

## 2018-05-13 NOTE — Patient Instructions (Signed)
Thank you for coming in today. Use the diclofenac gel on the knees up to 4x daily.  If not improved we will do a steroid injection.  For the hip work on the side leg raises and cross over stretch and figure 4 stretch.  For the shoulder do the band exercises.  Up to the front, up to the side, external and internal rotation about 30 reps 2x daily.   The knee pain is likely exacerbation of arthritis.   The hip pain is likely trochanteric bursts and   The shoulder pain is likely subacromial bursts or rotator cuff tendonitis.    Trochanteric Bursitis Trochanteric bursitis is a condition that causes hip pain. Trochanteric bursitis happens when fluid-filled sacs (bursae) in the hip get irritated. Normally these sacs absorb shock and help strong bands of tissue (tendons) in your hip glide smoothly over each other and over your hip bones. What are the causes? This condition results from increased friction between the hip bones and the tendons that go over them. This condition can happen if you:  Have weak hips.  Use your hip muscles too much (overuse).  Get hit in the hip. What increases the risk? This condition is more likely to develop in:  Women.  Adults who are middle-aged or older.  People with arthritis or a spinal condition.  People with weak buttocks muscles (gluteal muscles).  People who have one leg that is shorter than the other.  People who participate in certain kinds of athletic activities, such as: ? Running sports, especially long-distance running. ? Contact sports, like football or martial arts. ? Sports in which falls may occur, like skiing. What are the signs or symptoms? The main symptom of this condition is pain and tenderness over the point of your hip. The pain may be:  Sharp and intense.  Dull and achy.  Felt on the outside of your thigh. It may increase when you:  Lie on your side.  Walk or run.  Go up on stairs.  Sit.  Stand up after  sitting.  Stand for long periods of time. How is this diagnosed? This condition may be diagnosed based on:  Your symptoms.  Your medical history.  A physical exam.  Imaging tests, such as: ? X-rays to check your bones. ? An MRI or ultrasound to check your tendons and muscles. During your physical exam, your health care provider will check the movement and strength of your hip. He or she may press on the point of your hip to check for pain. How is this treated? This condition may be treated by:  Resting.  Reducing your activity.  Avoiding activities that cause pain.  Using crutches, a cane, or a walker to decrease the strain on your hip.  Taking medicine to help with swelling.  Having medicine injected into the bursae to help with swelling.  Using ice, heat, and massage therapy for pain relief.  Physical therapy exercises for strength and flexibility.  Surgery (rare). Follow these instructions at home: Activity  Rest.  Avoid activities that cause pain.  Return to your normal activities as told by your health care provider. Ask your health care provider what activities are safe for you. Managing pain, stiffness, and swelling  Take over-the-counter and prescription medicines only as told by your health care provider.  If directed, apply heat to the injured area as told by your health care provider. ? Place a towel between your skin and the heat source. ? Leave the heat  on for 20-30 minutes. ? Remove the heat if your skin turns bright red. This is especially important if you are unable to feel pain, heat, or cold. You may have a greater risk of getting burned.  If directed, apply ice to the injured area: ? Put ice in a plastic bag. ? Place a towel between your skin and the bag. ? Leave the ice on for 20 minutes, 2-3 times a day. General instructions  If the affected leg is one that you use for driving, ask your health care provider when it is safe to drive.   Use crutches, a cane, or a walker as told by your health care provider.  If one of your legs is shorter than the other, get fitted for a shoe insert.  Lose weight if you are overweight. How is this prevented?  Wear supportive footwear that is appropriate for your sport.  If you have hip pain, start any new exercise or sport slowly.  Maintain physical fitness, including: ? Strength. ? Flexibility. Contact a health care provider if:  Your pain does not improve with 2-4 weeks. Get help right away if:  You develop severe pain.  You have a fever.  You develop increased redness over your hip.  You have a change in your bowel function or bladder function.  You cannot control the muscles in your feet. This information is not intended to replace advice given to you by your health care provider. Make sure you discuss any questions you have with your health care provider. Document Released: 02/03/2004 Document Revised: 09/01/2015 Document Reviewed: 12/11/2014 Elsevier Interactive Patient Education  2019 [place] Cuff Tendinitis  Rotator cuff tendinitis is inflammation of the tough, cord-like bands that connect muscle to bone (tendons) in the rotator cuff. The rotator cuff includes all of the muscles and tendons that connect the arm to the shoulder. The rotator cuff holds the head of the upper arm bone (humerus) in the cup (fossa) of the shoulder blade (scapula). This condition can lead to a long-lasting (chronic) tear. The tear may be partial or complete. What are the causes? This condition is usually caused by overusing the rotator cuff. What increases the risk? This condition is more likely to develop in athletes and workers who frequently use their shoulder or reach over their heads. This can include activities such as:  Tennis.  Baseball or softball.  Swimming.  Construction work.  Painting. What are the signs or symptoms? Symptoms of this condition  include:  Pain spreading (radiating) from the shoulder to the upper arm.  Swelling and tenderness in front of the shoulder.  Pain when reaching, pulling, or lifting the arm above the head.  Pain when lowering the arm from above the head.  Minor pain in the shoulder when resting.  Increased pain in the shoulder at night.  Difficulty placing the arm behind the back. How is this diagnosed? This condition is diagnosed with a medical history and physical exam. Tests may also be done, including:  X-rays.  MRI.  Ultrasounds.  CT or MR arthrogram. During this test, a contrast material is injected and then images are taken. How is this treated? Treatment for this condition depends on the severity of the condition. In less severe cases, treatment may include:  Rest. This may be done with a sling that holds the shoulder still (immobilization). Your health care provider may also recommend avoiding activities that involve lifting your arm over your head.  Icing the  shoulder.  Anti-inflammatory medicines, such as aspirin or ibuprofen. In more severe cases, treatment may include:  Physical therapy.  Steroid injections.  Surgery. Follow these instructions at home: If you have a sling:  Wear the sling as told by your health care provider. Remove it only as told by your health care provider.  Loosen the sling if your fingers tingle, become numb, or turn cold and blue.  Keep the sling clean.  If the sling is not waterproof, do not let it get wet. Remove it, if allowed, or cover it with a watertight covering when you take a bath or shower. Managing pain, stiffness, and swelling  If directed, put ice on the injured area. ? If you have a removable sling, remove it as told by your health care provider. ? Put ice in a plastic bag. ? Place a towel between your skin and the bag. ? Leave the ice on for 20 minutes, 2-3 times a day.  Move your fingers often to avoid stiffness and to  lessen swelling.  Raise (elevate) the injured area above the level of your heart while you are lying down.  Find a comfortable sleeping position or sleep on a recliner, if available. Driving  Do not drive or use heavy machinery while taking prescription pain medicine.  Ask your health care provider when it is safe to drive if you have a sling on your arm. Activity  Rest your shoulder as told by your health care provider.  Return to your normal activities as told by your health care provider. Ask your health care provider what activities are safe for you.  Do any exercises or stretches as told by your health care provider.  If you do repetitive overhead tasks, take small breaks in between and include stretching exercises as told by your health care provider. General instructions  Do not use any products that contain nicotine or tobacco, such as cigarettes and e-cigarettes. These can delay healing. If you need help quitting, ask your health care provider.  Take over-the-counter and prescription medicines only as told by your health care provider.  Keep all follow-up visits as told by your health care provider. This is important. Contact a health care provider if:  Your pain gets worse.  You have new pain in your arm, hands, or fingers.  Your pain is not relieved with medicine or does not get better after 6 weeks of treatment.  You have cracking sensations when moving your shoulder in certain directions.  You hear a snapping sound after using your shoulder, followed by severe pain and weakness. Get help right away if:  Your arm, hand, or fingers are numb or tingling.  Your arm, hand, or fingers are swollen or painful or they turn white or blue. Summary  Rotator cuff tendinitis is inflammation of the tough, cord-like bands that connect muscle to bone (tendons) in the rotator cuff.  This condition is usually caused by overusing the rotator cuff, which includes all of the  muscles and tendons that connect the arm to the shoulder.  This condition is more likely to develop in athletes and workers who frequently use their shoulder or reach over their heads.  Treatment generally includes rest, anti-inflammatory medicines, and icing. In some cases, physical therapy and steroid injections may be needed. In severe cases, surgery may be needed. This information is not intended to replace advice given to you by your health care provider. Make sure you discuss any questions you have with your health care  provider. Document Released: 03/18/2003 Document Revised: 12/13/2015 Document Reviewed: 12/13/2015 Elsevier Interactive Patient Education  2019 [employer].

## 2018-05-13 NOTE — Progress Notes (Signed)
Julia Larson is a 63 y.o. female who presents to [facility] today for knee pain, shoulder pain, hip pain.  Krystin notes that she has been working from home recently over the last 6 weeks.  She has been walking more than usual and doing more cleaning around the house than usual.  Additionally she notes that she is gained some weight because she has been snacking more.   BL knee pain: Present mildly for months but worsening over the last 6 weeks.  She notes sometimes the pain interferes with sleep.  She notes the pain is typically dull but occasionally has sharp.  She denies locking catching or giving way.  She is tried Aspercreme IcyHot elevation heat and cold as well as Tylenol which helps a little.   Right hip pain: Patient notes right lateral hip pain worse over the last 6 weeks or so.  She notes pain is bothersome at sleep especially when she lays on her right side.  Pain is also present when she stands from a seated position.  She is tried the above treatment which helped a little.  She denies any injury.    Additionally she notes right shoulder pain.  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  She feels as of the lipoma as getting slightly bigger.  However she notes right lateral upper arm and shoulder pain over the last 6 weeks with increased activity.  Pain is worse with overhead motion reaching back.  She notes some clicking and popping but denies any giving way.  She notes that she cannot take oral NSAIDs due to history of gastric bypass.  ROS:  As above  Exam:  BP 124/63   Pulse 81   Temp 98.2 F (36.8 C) (Oral)   Wt 205 lb (93 kg)   BMI 35.19 kg/m  Wt Readings from Last 5 Encounters:  05/13/18 205 lb (93 kg)  03/06/18 199 lb (90.3 kg)  02/11/18 195 lb (88.5 kg)  01/28/18 196 lb (88.9 kg)  01/21/18 195 lb (88.5 kg)   General: Well Developed, well  nourished, and in no acute distress.  Neuro/Psych: Alert and oriented x3, extra-ocular muscles intact, able to move all 4 extremities, sensation grossly intact. Skin: Warm and dry, no rashes noted.  Respiratory: Not using accessory muscles, speaking in full sentences, trachea midline.  Cardiovascular: Pulses palpable, no extremity edema. Abdomen: Does not appear distended. MSK:  Right shoulder: Normal-appearing. Normal range of motion. Fullness palpated subcutaneous tissue at acromion.  Slight nodularity present but no clear distinctive lipoma.  Not particularly tender to palpation. Mildly positive Hawkins and Neer's test. Mildly positive empty can test. Intact abduction external and internal rotational strength.  Contralateral left shoulder normal-appearing nontender normal motion.  Right hip: Normal-appearing Normal motion. Tender palpation greater trochanter. Hip abduction strength diminished 4/5.  External and internal rotational strength intact.  Contralateral left hip normal-appearing normal motion.  Right knee: Obese body habitus difficult to appreciate effusion.  No erythema or induration or deformity. Mildly tender to palpation medial joint line. Normal range of motion without crepitation. Stable ligamentous exam. Mildly positive Murray's test. Intact strength.  Left knee: Again obese body habitus difficult to appreciate effusion no erythema induration or deformity. Mildly tender palpation medial and lateral joint line. Normal range of motion without crepitation.  Stable given his exam.  Intact strength. Mildly positive McMurray's test.    Lab and  Radiology Results Limited musculoskeletal ultrasound right shoulder reveals thickened subcutaneous tissue possible lipoma. Thickened subacromial bursa sac present.  Intact supraspinatus tendon.  Normal bony structures. Impression possible lipoma does not appear to be impinging on deep tissue.  Present subacromial bursitis.   EXAM: RIGHT KNEE - COMPLETE 4+ VIEW; LEFT KNEE - 1-2 VIEW; DG HIP W/ PELVIS 2V BILAT  COMPARISON:  None.  FINDINGS: Bilateral knees:  There are mild degenerative changes bilaterally with early joint space narrowing and peaking of the tibial spines. No acute fracture or osteochondral abnormality. No definite joint effusion.  Bilateral hips:  Mild bilateral hip joint degenerative changes with early joint space narrowing and osteophytic spurring. No acute fracture or plain film evidence of avascular necrosis. The pubic symphysis and SI joints are intact. No pelvic fractures.  IMPRESSION: Mild/early degenerative changes but no acute bony findings.   Electronically Signed   By: Kalman Jewels M.D.   On: 01/21/2014 12:22  I personally (independently) visualized and performed the interpretation of the images attached in this note.   Assessment and Plan: 63 y.o. female with  Right shoulder pain: Likely rotator cuff tendinopathy with bursitis.  Doubtful that lipoma is much of a factor here. Plan for home exercise program and if not improving neck step would be injection.  Lateral hip pain: Trochanteric bursitis versus hip abductor tendinopathy.  Plan for home exercise program working on stretching and strengthening.  Recheck if not improving.  Knee pain bilaterally likely exacerbation of DJD due to increased activity.  Plan for diclofenac gel trial.  If not better next up would be steroid injection.   No orders of the defined types were placed in this encounter.  Meds ordered this encounter  Medications  . diclofenac sodium (VOLTAREN) 1 % GEL    Sig: Apply 4 g topically 4 (four) times daily. To affected joint.    Dispense:  400 g    Refill:  11    BL knee OA. Cannot tolerate oral NSAIDs. Failed tylneol    Historical information moved to improve visibility of documentation.  Past Medical History:  Diagnosis Date  . Depression   . Hx of adenomatous colonic  polyps 05/20/2016  . Influenza   . Inverted nipple    left side  . Lipoma of shoulder   . OA (osteoarthritis)    both knees, right hip  . Obesity    Past Surgical History:  Procedure Laterality Date  . ABDOMINAL HYSTERECTOMY    . ANTERIOR CERVICAL DECOMP/DISCECTOMY FUSION  1999  . BREAST EXCISIONAL BIOPSY Right   . BREAST LUMPECTOMY  1984  . BREAST REDUCTION SURGERY Bilateral 1996  . CESAREAN SECTION    . CHOLECYSTECTOMY    . COLONOSCOPY    . DILATION AND CURETTAGE OF UTERUS  1982  . GASTRIC BYPASS  2000  . REDUCTION MAMMAPLASTY    . TONSILLECTOMY  1973  . WRIST FRACTURE SURGERY Right 2014   Social History   Tobacco Use  . Smoking status: Former Smoker    Last attempt to quit: 11/09/1985    Years since quitting: 32.5  . Smokeless tobacco: Never Used  Substance Use Topics  . Alcohol use: Yes    Alcohol/week: 0.0 standard drinks    Comment: 1-2 glasses per week (09/21/14)   family history includes Alcohol abuse in her brother and sister; Cancer in her father, maternal aunt, maternal aunt, mother, and paternal aunt; Cancer (age of onset: 53) in her sister; Colon cancer in her cousin, maternal aunt,  maternal aunt, and another family member; Diabetes in an other family member; Hypertension in an other family member; Lung cancer in her mother.  Medications: Current Outpatient Medications  Medication Sig Dispense Refill  . acetaminophen (TYLENOL) 650 MG CR tablet Take 2 tablets (1,300 mg total) by mouth every 8 (eight) hours as needed for pain. 90 tablet 3  . diclofenac sodium (VOLTAREN) 1 % GEL Apply 4 g topically 4 (four) times daily. To affected joint. 400 g 11  . fluticasone (FLONASE) 50 MCG/ACT nasal spray Place 2 sprays into both nostrils daily. 16 g 0  . levocetirizine (XYZAL) 5 MG tablet Take 1 tablet (5 mg total) by mouth every evening. 30 tablet 0  . Multiple Vitamin (MULTIVITAMIN) tablet Take 1 tablet by mouth daily.    . nitrofurantoin, macrocrystal-monohydrate,  (MACROBID) 100 MG capsule Take 1 capsule (100 mg total) by mouth 2 (two) times daily. 14 capsule 0  . omeprazole (PRILOSEC) 40 MG capsule Take 1 capsule (40 mg total) by mouth daily. 90 capsule 3  . traZODone (DESYREL) 50 MG tablet Take 0.5-1 tablets (25-50 mg total) by mouth at bedtime as needed for sleep. 30 tablet 3   No current facility-administered medications for this visit.    Allergies  Allergen Reactions  . Adhesive [Tape]     Skin blisters  . Morphine And Related   . Sulfa Antibiotics Rash      Discussed warning signs or symptoms. Please see discharge instructions. Patient expresses understanding.

## 2018-07-05 ENCOUNTER — Ambulatory Visit: Payer: BC Managed Care – PPO | Admitting: Physician Assistant

## 2018-07-05 ENCOUNTER — Encounter: Payer: Self-pay | Admitting: Physician Assistant

## 2018-07-05 VITALS — BP 140/61 | HR 72 | Temp 98.1°F | Ht 64.0 in | Wt 204.0 lb

## 2018-07-05 DIAGNOSIS — R300 Dysuria: Secondary | ICD-10-CM | POA: Diagnosis not present

## 2018-07-05 DIAGNOSIS — L290 Pruritus ani: Secondary | ICD-10-CM | POA: Diagnosis not present

## 2018-07-05 LAB — POCT URINALYSIS DIPSTICK
Bilirubin, UA: NEGATIVE
Blood, UA: NEGATIVE
Glucose, UA: NEGATIVE
Ketones, UA: NEGATIVE
Leukocytes, UA: NEGATIVE
Nitrite, UA: NEGATIVE
Protein, UA: NEGATIVE
Spec Grav, UA: <=1.005 — AB (ref 1.010–1.025)
Urobilinogen, UA: 0.2 E.U./dL
pH, UA: 5.5 (ref 5.0–8.0)

## 2018-07-05 LAB — WET PREP FOR TRICH, YEAST, CLUE
MICRO NUMBER:: 611802
Specimen Quality: ADEQUATE

## 2018-07-05 MED ORDER — ALBENDAZOLE 200 MG PO TABS: 400.0000 mg | ORAL_TABLET | Freq: Once | ORAL | 0 refills | Status: AC

## 2018-07-05 MED ORDER — FLUCONAZOLE 150 MG PO TABS: 150.0000 mg | ORAL_TABLET | Freq: Once | ORAL | 0 refills | Status: AC

## 2018-07-05 NOTE — Progress Notes (Signed)
Call pt: wet prep negative for trich, yeast, bV but I would take diflucan. At times the wet prep is not sensitive enough to pick up on yeast.

## 2018-07-05 NOTE — Progress Notes (Signed)
Subjective:    Patient ID: Julia Larson, female    DOB: 1955/10/15, 63 y.o.   MRN: [id number]  HPI  Pt is a 63 yo female who presents to the clinic for rectal itching. She denies any discharge. This has been bothering her for 2 days. Bothers her more at night. Denies any pain. BM have been a little loose. No travel. No new foods. No abdominal pain. She has been gardening and in the dirt a lot. It reminds her of pinworms. She does report some irritation when urinates. No fever, chills, lower abdominal pain or flank pain. She has tried some prep H to see if made better but no improvement.   .. Active Ambulatory Problems    Diagnosis Date Noted  . H/O gastric bypass 01/21/2014  . History of diabetes mellitus 01/21/2014  . Primary osteoarthritis of right hip 01/26/2014  . Primary osteoarthritis of both knees 02/17/2014  . Lipoma of shoulder 02/17/2014  . Nocturnal muscle cramps 05/28/2014  . Lumbar radiculopathy 09/21/2014  . RLS (restless legs syndrome) 09/21/2014  . Fatigue 09/21/2014  . Actinic keratosis due to exposure to sunlight 10/16/2014  . Low iron stores 10/16/2014  . Obesity 08/04/2015  . Depression 08/04/2015  . Hx of adenomatous colonic polyps 05/20/2016  . Family history of stomach cancer 03/01/2017  . Family history of colon cancer 03/01/2017  . Primary insomnia 03/01/2017  . Anxiety disorder 03/01/2017  . Gastroesophageal reflux disease with esophagitis 03/01/2017  . Stress at work 04/04/2017  . Achilles tendinitis of right lower extremity 08/29/2017  . Decreased diastolic blood pressure 94/70/9628  . Bradycardia 02/03/2018  . Atypical chest pain 02/03/2018  . Excessive daytime sleepiness 02/03/2018  . Vertigo, benign paroxysmal, bilateral 02/03/2018  . At risk for obstructive sleep apnea 02/03/2018  . Frontal headache 02/03/2018  . Vitamin D insufficiency 02/05/2018  . Low serum ferritin level 02/05/2018   Resolved Ambulatory Problems    Diagnosis Date Noted   . Shingles outbreak 01/21/2014  . De Quervain's tenosynovitis, right 01/21/2014  . Mass of skin of right shoulder 01/21/2014  . Right wrist pain 01/26/2014  . Right leg pain 01/26/2014  . Solar lentigo 10/16/2014  . Breast pain, left 10/22/2014  . Cough 03/01/2017  . Class 1 obesity due to excess calories without serious comorbidity with body mass index (BMI) of 33.0 to 33.9 in adult 03/01/2017  . Grief reaction 03/01/2017  . Lipoma of right shoulder 03/01/2017   Past Medical History:  Diagnosis Date  . Influenza   . Inverted nipple   . OA (osteoarthritis)       Review of Systems See HPI>     Objective:   Physical Exam Vitals signs reviewed.  Cardiovascular:     Rate and Rhythm: Normal rate and regular rhythm.  Pulmonary:     Effort: Pulmonary effort is normal.  Abdominal:     General: Abdomen is flat. There is no distension.     Palpations: Abdomen is soft.     Tenderness: There is no abdominal tenderness.  Genitourinary:    General: Normal vulva.     Comments: Small skin tag around anus.  Erythematous skin in perianal area.  Neurological:     General: No focal deficit present.     Mental Status: She is alert.  Psychiatric:        Mood and Affect: Mood normal.           Assessment & Plan:  .Marland KitchenMarland KitchenTammy was seen today for anal itching.  Diagnoses and all orders for this visit:  Rectal itching -     POCT urinalysis dipstick -     WET PREP FOR TRICH, YEAST, CLUE -     albendazole (ALBENZA) 200 MG tablet; Take 2 tablets (400 mg total) by mouth once for 1 dose. Repeat in 2 weeks. -     fluconazole (DIFLUCAN) 150 MG tablet; Take 1 tablet (150 mg total) by mouth once for 1 dose. Repeat in 48-72 hours.  Burning with urination -     POCT urinalysis dipstick -     WET PREP FOR TRICH, YEAST, CLUE     .Marland Kitchen Results for orders placed or performed in visit on 07/05/18  WET PREP FOR [place], YEAST, CLUE  Result Value Ref Range   MICRO NUMBER: [id number]    Specimen  Quality Adequate    SOURCE: NOT GIVEN    Status FINAL    RESULT      No Trichomonas vaginalis seen. No yeast seen No clue cells seen EPITHELIAL CELLS SEEN  POCT urinalysis dipstick  Result Value Ref Range   Color, UA yellow    Clarity, UA clear    Glucose, UA Negative Negative   Bilirubin, UA negative    Ketones, UA negative    Spec Grav, UA <=1.005 (A) 1.010 - 1.025   Blood, UA negative    pH, UA 5.5 5.0 - 8.0   Protein, UA Negative Negative   Urobilinogen, UA 0.2 0.2 or 1.0 E.U./dL   Nitrite, UA negative    Leukocytes, UA Negative Negative   Appearance     Odor     No UTI.  Will treat for pinworms due to risk factors and yeast. Perianal area did appear to be inflamed. Follow up as needed.

## 2018-09-23 ENCOUNTER — Encounter: Payer: Self-pay | Admitting: Physician Assistant

## 2018-09-23 ENCOUNTER — Other Ambulatory Visit: Payer: Self-pay

## 2018-09-23 ENCOUNTER — Ambulatory Visit (INDEPENDENT_AMBULATORY_CARE_PROVIDER_SITE_OTHER): Payer: BC Managed Care – PPO | Admitting: Physician Assistant

## 2018-09-23 VITALS — BP 134/80 | Temp 97.2°F | Ht 64.0 in | Wt 197.0 lb

## 2018-09-23 DIAGNOSIS — Z20822 Contact with and (suspected) exposure to covid-19: Secondary | ICD-10-CM

## 2018-09-23 DIAGNOSIS — R110 Nausea: Secondary | ICD-10-CM | POA: Diagnosis not present

## 2018-09-23 DIAGNOSIS — R51 Headache: Secondary | ICD-10-CM

## 2018-09-23 DIAGNOSIS — R197 Diarrhea, unspecified: Secondary | ICD-10-CM

## 2018-09-23 DIAGNOSIS — R6883 Chills (without fever): Secondary | ICD-10-CM

## 2018-09-23 DIAGNOSIS — R519 Headache, unspecified: Secondary | ICD-10-CM

## 2018-09-23 DIAGNOSIS — J3489 Other specified disorders of nose and nasal sinuses: Secondary | ICD-10-CM

## 2018-09-23 DIAGNOSIS — J029 Acute pharyngitis, unspecified: Secondary | ICD-10-CM

## 2018-09-23 MED ORDER — LEVOCETIRIZINE DIHYDROCHLORIDE 5 MG PO TABS: 5.0000 mg | ORAL_TABLET | Freq: Every evening | ORAL | 3 refills | Status: AC

## 2018-09-23 NOTE — Progress Notes (Signed)
Patient ID: Julia Larson, female   DOB: 1955/06/15, 63 y.o.   MRN: [id number] .Marland KitchenVirtual Visit via Video Note  I connected with Maricella Nugent on 09/23/18 at  1:20 PM EDT by a video enabled telemedicine application and verified that I am speaking with the correct person using two identifiers.  Location: Patient: home Provider: clinic   I discussed the limitations of evaluation and management by telemedicine and the availability of in person appointments. The patient expressed understanding and agreed to proceed.  History of Present Illness: Patient is a 63 year old female who presents to the clinic with 5 days of headache, 3 days of scratchy throat and nausea and diarrhea that started this morning.  Since these are COVID symptoms she could not go to work until she is tested.  She has a history of allergies and has ran out of her allergy medicine.  She usually takes Xyzal.  She denies any fever, shortness of breath, wheezing, loss of smell or taste.  Her throat just feels scratchy in her chest feels like it is burning when she takes a deep breath.  All day today she has felt very cold.  Patient has had no direct contact with a COVID positive patient.  No other known sick contacts.    .. Active Ambulatory Problems    Diagnosis Date Noted  . H/O gastric bypass 01/21/2014  . History of diabetes mellitus 01/21/2014  . Primary osteoarthritis of right hip 01/26/2014  . Primary osteoarthritis of both knees 02/17/2014  . Lipoma of shoulder 02/17/2014  . Nocturnal muscle cramps 05/28/2014  . Lumbar radiculopathy 09/21/2014  . RLS (restless legs syndrome) 09/21/2014  . Fatigue 09/21/2014  . Actinic keratosis due to exposure to sunlight 10/16/2014  . Low iron stores 10/16/2014  . Obesity 08/04/2015  . Depression 08/04/2015  . Hx of adenomatous colonic polyps 05/20/2016  . Family history of stomach cancer 03/01/2017  . Family history of colon cancer 03/01/2017  . Primary insomnia 03/01/2017  .  Anxiety disorder 03/01/2017  . Gastroesophageal reflux disease with esophagitis 03/01/2017  . Stress at work 04/04/2017  . Achilles tendinitis of right lower extremity 08/29/2017  . Decreased diastolic blood pressure A999333  . Bradycardia 02/03/2018  . Atypical chest pain 02/03/2018  . Excessive daytime sleepiness 02/03/2018  . Vertigo, benign paroxysmal, bilateral 02/03/2018  . At risk for obstructive sleep apnea 02/03/2018  . Frontal headache 02/03/2018  . Vitamin D insufficiency 02/05/2018  . Low serum ferritin level 02/05/2018   Resolved Ambulatory Problems    Diagnosis Date Noted  . Shingles outbreak 01/21/2014  . De Quervain's tenosynovitis, right 01/21/2014  . Mass of skin of right shoulder 01/21/2014  . Right wrist pain 01/26/2014  . Right leg pain 01/26/2014  . Solar lentigo 10/16/2014  . Breast pain, left 10/22/2014  . Cough 03/01/2017  . Class 1 obesity due to excess calories without serious comorbidity with body mass index (BMI) of 33.0 to 33.9 in adult 03/01/2017  . Grief reaction 03/01/2017  . Lipoma of right shoulder 03/01/2017   Past Medical History:  Diagnosis Date  . Influenza   . Inverted nipple   . OA (osteoarthritis)    Reviewed med, allergy, problem list.        Observations/Objective: No acute distress. Normal breathing.  Normal appearance and mood.   .. Today's Vitals   09/23/18 1149  BP: 134/80  Temp: (!) 97.2 F (36.2 C)  TempSrc: Oral  Weight: 197 lb (89.4 kg)  Height: 5\' 4"  (1.626 m)  Body mass index is 33.81 kg/m.    Assessment and Plan: Marland KitchenMarland KitchenTammy was seen today for headache.  Diagnoses and all orders for this visit:  Sore throat  Acute nonintractable headache, unspecified headache type  Sinus pressure  Nausea  Diarrhea, unspecified type  Chills  Other orders -     levocetirizine (XYZAL) 5 MG tablet; Take 1 tablet (5 mg total) by mouth every evening.   Unclear etiology. She does have COVID symptoms. Pt  will go to [facility]. She was written out of work to self isolate until results are in due to [facility] guidelines. Continue to treat symptomatically with flonase, tylenol cold sinus severe. Call with any worsening symptoms.   Follow Up Instructions:    I discussed the assessment and treatment plan with the patient. The patient was provided an opportunity to ask questions and all were answered. The patient agreed with the plan and demonstrated an understanding of the instructions.   The patient was advised to call back or seek an in-person evaluation if the symptoms worsen or if the condition fails to improve as anticipated.    Iran Planas, PA-C

## 2018-09-23 NOTE — Progress Notes (Deleted)
5 day history: Headache  Scratchy throat Sinus issues  This morning: Nausea Diarrhea  No fever, SOB, cough  She thinks it is allergies, but her job is requiring r/o Covid.

## 2018-09-24 LAB — NOVEL CORONAVIRUS, NAA: SARS-CoV-2, NAA: NOT DETECTED

## 2018-10-07 ENCOUNTER — Encounter: Payer: Self-pay | Admitting: Physician Assistant

## 2018-10-07 NOTE — Telephone Encounter (Signed)
Ok to call and get on nurse schedule.

## 2018-10-11 ENCOUNTER — Ambulatory Visit: Payer: BC Managed Care – PPO | Admitting: Family Medicine

## 2018-10-11 ENCOUNTER — Other Ambulatory Visit: Payer: Self-pay

## 2018-10-11 ENCOUNTER — Encounter: Payer: Self-pay | Admitting: Family Medicine

## 2018-10-11 VITALS — BP 128/79 | HR 62 | Wt 207.0 lb

## 2018-10-11 DIAGNOSIS — M7711 Lateral epicondylitis, right elbow: Secondary | ICD-10-CM | POA: Diagnosis not present

## 2018-10-11 MED ORDER — HYDROCODONE-ACETAMINOPHEN 5-325 MG PO TABS: 1.0000 | ORAL_TABLET | Freq: Four times a day (QID) | ORAL | 0 refills | Status: DC | PRN

## 2018-10-11 NOTE — Progress Notes (Signed)
Julia Larson is a 63 y.o. female who presents to [facility] today for follow-up right elbow pain.  Patient has had worsening right lateral elbow pain over the past week or so  She is had injection at right lateral epicondyles in December 2019.  It worked until recently.  She notes her pain is worse with activity and with night.  She is been trying some home exercises which have not helped.  She denies any pain radiating down her arm weakness or numbness.  No fevers or chills.   ROS:  As above  Exam:  BP 128/79   Pulse 62   Wt 207 lb (93.9 kg)   BMI 35.53 kg/m  Wt Readings from Last 5 Encounters:  10/11/18 207 lb (93.9 kg)  09/23/18 197 lb (89.4 kg)  07/05/18 204 lb (92.5 kg)  05/13/18 205 lb (93 kg)  03/06/18 199 lb (90.3 kg)   General: Well Developed, well nourished, and in no acute distress.  Neuro/Psych: Alert and oriented x3, extra-ocular muscles intact, able to move all 4 extremities, sensation grossly intact. Skin: Warm and dry, no rashes noted.  Respiratory: Not using accessory muscles, speaking in full sentences, trachea midline.  Cardiovascular: Pulses palpable, no extremity edema. Abdomen: Does not appear distended. MSK:  Right elbow normal-appearing without significant deformity. Tender palpation lateral epicondyle. Pain with resisted wrist extension. Elbow strength is intact.  Pulses cap refill and sensation are intact distally.    Lab and Radiology Results Procedure: Real-time Ultrasound Guided Injection of right lateral epicondyle Device: GE Logiq E   Images permanently stored and available for review in the ultrasound unit. Verbal informed consent obtained.  Discussed risks and benefits of procedure. Warned about infection bleeding damage to structures skin hypopigmentation and fat atrophy among others. Patient expresses understanding and agreement Time-out conducted.   Noted no overlying erythema, induration,  or other signs of local infection.   Skin prepped in a sterile fashion.   Local anesthesia: Topical Ethyl chloride.   With sterile technique and under real time ultrasound guidance:  40 mg of Kenalog and 2 mL of Marcaine injected easily.   Completed without difficulty   Pain immediately resolved suggesting accurate placement of the medication.   Advised to call if fevers/chills, erythema, induration, drainage, or persistent bleeding.   Images permanently stored and available for review in the ultrasound unit.  Impression: Technically successful ultrasound guided injection.         Assessment and Plan: 63 y.o. female with right elbow pain lateral epicondylitis failing typical conservative management.  Plan for repeat injection today.  Plan to proceed with continued home exercise program.  Recheck if needed if not improving.   PDMP reviewed during this encounter. No orders of the defined types were placed in this encounter.  Meds ordered this encounter  Medications  . HYDROcodone-acetaminophen (NORCO/VICODIN) 5-325 MG tablet    Sig: Take 1 tablet by mouth every 6 (six) hours as needed.    Dispense:  10 tablet    Refill:  0    Historical information moved to improve visibility of documentation.  Past Medical History:  Diagnosis Date  . Depression   . Hx of adenomatous colonic polyps 05/20/2016  . Influenza   . Inverted nipple    left side  . Lipoma of shoulder   . OA (osteoarthritis)    both knees, right hip  . Obesity    Past Surgical History:  Procedure Laterality Date  . ABDOMINAL HYSTERECTOMY    .  ANTERIOR CERVICAL DECOMP/DISCECTOMY FUSION  1999  . BREAST EXCISIONAL BIOPSY Right   . BREAST LUMPECTOMY  1984  . BREAST REDUCTION SURGERY Bilateral 1996  . CESAREAN SECTION    . CHOLECYSTECTOMY    . COLONOSCOPY    . DILATION AND CURETTAGE OF UTERUS  1982  . GASTRIC BYPASS  2000  . REDUCTION MAMMAPLASTY    . TONSILLECTOMY  1973  . WRIST FRACTURE SURGERY Right 2014    Social History   Tobacco Use  . Smoking status: Former Smoker    Quit date: 11/09/1985    Years since quitting: 32.9  . Smokeless tobacco: Never Used  Substance Use Topics  . Alcohol use: Yes    Alcohol/week: 0.0 standard drinks    Comment: 1-2 glasses per week (09/21/14)   family history includes Alcohol abuse in her brother and sister; Cancer in her father, maternal aunt, maternal aunt, mother, and paternal aunt; Cancer (age of onset: 41) in her sister; Colon cancer in her cousin, maternal aunt, maternal aunt, and another family member; Diabetes in an other family member; Hypertension in an other family member; Lung cancer in her mother.  Medications: Current Outpatient Medications  Medication Sig Dispense Refill  . acetaminophen (TYLENOL) 650 MG CR tablet Take 2 tablets (1,300 mg total) by mouth every 8 (eight) hours as needed for pain. 90 tablet 3  . diclofenac sodium (VOLTAREN) 1 % GEL Apply 4 g topically 4 (four) times daily. To affected joint. 400 g 11  . HYDROcodone-acetaminophen (NORCO/VICODIN) 5-325 MG tablet Take 1 tablet by mouth every 6 (six) hours as needed. 10 tablet 0  . levocetirizine (XYZAL) 5 MG tablet Take 1 tablet (5 mg total) by mouth every evening. 90 tablet 3  . Multiple Vitamin (MULTIVITAMIN) tablet Take 1 tablet by mouth daily.    Marland Kitchen omeprazole (PRILOSEC) 40 MG capsule Take 1 capsule (40 mg total) by mouth daily. 90 capsule 3   No current facility-administered medications for this visit.    Allergies  Allergen Reactions  . Adhesive [Tape]     Skin blisters  . Morphine And Related   . Sulfa Antibiotics Rash      Discussed warning signs or symptoms. Please see discharge instructions. Patient expresses understanding.

## 2018-10-11 NOTE — Patient Instructions (Addendum)
Thank you for coming in today. Call or go to the ER if you develop a large red swollen joint with extreme pain or oozing puss.  Continue home exercises.  Recheck as needed.  Use norco for severe pain.

## 2018-11-06 ENCOUNTER — Ambulatory Visit (INDEPENDENT_AMBULATORY_CARE_PROVIDER_SITE_OTHER): Payer: BC Managed Care – PPO

## 2018-11-06 ENCOUNTER — Ambulatory Visit (INDEPENDENT_AMBULATORY_CARE_PROVIDER_SITE_OTHER): Payer: BC Managed Care – PPO | Admitting: Physician Assistant

## 2018-11-06 ENCOUNTER — Other Ambulatory Visit: Payer: Self-pay

## 2018-11-06 VITALS — BP 151/56 | HR 60 | Ht 63.0 in | Wt 208.0 lb

## 2018-11-06 DIAGNOSIS — S3992XA Unspecified injury of lower back, initial encounter: Secondary | ICD-10-CM

## 2018-11-06 DIAGNOSIS — F339 Major depressive disorder, recurrent, unspecified: Secondary | ICD-10-CM | POA: Diagnosis not present

## 2018-11-06 MED ORDER — TIZANIDINE HCL 4 MG PO TABS: 4.0000 mg | ORAL_TABLET | Freq: Four times a day (QID) | ORAL | 0 refills | Status: DC | PRN

## 2018-11-06 MED ORDER — BUPROPION HCL ER (XL) 150 MG PO TB24: 150.0000 mg | ORAL_TABLET | ORAL | 0 refills | Status: DC

## 2018-11-06 MED ORDER — KETOROLAC TROMETHAMINE 60 MG/2ML IM SOLN
60.0000 mg | Freq: Once | INTRAMUSCULAR | Status: AC
  Administered 2018-11-06: 60 mg via INTRAMUSCULAR
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
## 2018-11-06 NOTE — Patient Instructions (Signed)

## 2018-11-07 ENCOUNTER — Encounter: Payer: Self-pay | Admitting: Physician Assistant

## 2018-11-07 NOTE — Progress Notes (Signed)
Julia Larson,   Mild arthritic changes. No acute concerns.   Julia Larson

## 2018-11-08 ENCOUNTER — Encounter: Payer: Self-pay | Admitting: Physician Assistant

## 2018-11-08 DIAGNOSIS — S3992XA Unspecified injury of lower back, initial encounter: Secondary | ICD-10-CM | POA: Insufficient documentation

## 2018-11-08 NOTE — Telephone Encounter (Signed)
Please rewrite note to stay 30lbs.

## 2018-12-25 ENCOUNTER — Telehealth: Payer: Self-pay

## 2018-12-25 NOTE — Telephone Encounter (Signed)
Letter written

## 2018-12-25 NOTE — Telephone Encounter (Signed)
Julia Larson called and states she started to get symptoms of nausea, diarrhea and fever on Monday. She also was Covid-19 tested on Monday. She is negative for Covid-19. She doesn't have insurance at the moment and is needing a work note. Her symptoms have mostly resolved with just one episode of diarrhea this morning. She states is she doesn't get a work not she might be terminated. She would like to return tomorrow. Her first day out was Monday.

## 2018-12-25 NOTE — Telephone Encounter (Signed)
Ok for note 

## 2018-12-25 NOTE — Telephone Encounter (Signed)
Pt aware that letter is complete and she can find it on her MyChart.

## 2019-01-26 ENCOUNTER — Other Ambulatory Visit: Payer: Self-pay | Admitting: Physician Assistant

## 2019-01-26 DIAGNOSIS — F339 Major depressive disorder, recurrent, unspecified: Secondary | ICD-10-CM

## 2019-03-11 ENCOUNTER — Other Ambulatory Visit: Payer: Self-pay

## 2019-03-11 ENCOUNTER — Encounter: Payer: Self-pay | Admitting: Physician Assistant

## 2019-03-11 ENCOUNTER — Ambulatory Visit (INDEPENDENT_AMBULATORY_CARE_PROVIDER_SITE_OTHER): Payer: Managed Care, Other (non HMO) | Admitting: Physician Assistant

## 2019-03-11 VITALS — Ht 63.0 in | Wt 196.0 lb

## 2019-03-11 DIAGNOSIS — R42 Dizziness and giddiness: Secondary | ICD-10-CM

## 2019-03-11 DIAGNOSIS — E559 Vitamin D deficiency, unspecified: Secondary | ICD-10-CM

## 2019-03-11 DIAGNOSIS — Z8639 Personal history of other endocrine, nutritional and metabolic disease: Secondary | ICD-10-CM

## 2019-03-11 DIAGNOSIS — R519 Headache, unspecified: Secondary | ICD-10-CM

## 2019-03-11 DIAGNOSIS — F339 Major depressive disorder, recurrent, unspecified: Secondary | ICD-10-CM | POA: Diagnosis not present

## 2019-03-11 DIAGNOSIS — I951 Orthostatic hypotension: Secondary | ICD-10-CM

## 2019-03-11 DIAGNOSIS — R000 Tachycardia, unspecified: Secondary | ICD-10-CM

## 2019-03-11 DIAGNOSIS — Z13220 Encounter for screening for lipoid disorders: Secondary | ICD-10-CM

## 2019-03-11 DIAGNOSIS — E162 Hypoglycemia, unspecified: Secondary | ICD-10-CM

## 2019-03-11 LAB — GLUCOSE, POCT (MANUAL RESULT ENTRY): POC Glucose: 121 mg/dl — AB (ref 70–99)

## 2019-03-11 MED ORDER — METHYLPREDNISOLONE SODIUM SUCC 125 MG IJ SOLR
125.0000 mg | Freq: Once | INTRAMUSCULAR | Status: AC
  Administered 2019-03-11: 125 mg via INTRAMUSCULAR

## 2019-03-11 MED ORDER — BUPROPION HCL ER (XL) 150 MG PO TB24: 150.0000 mg | ORAL_TABLET | Freq: Every day | ORAL | 3 refills | Status: DC

## 2019-03-11 MED ORDER — BLOOD GLUCOSE MONITOR KIT: PACK | 0 refills | Status: AC

## 2019-03-11 NOTE — Progress Notes (Signed)
Subjective:    Patient ID: Julia Larson, female    DOB: 22-Jan-1955, 64 y.o.   MRN: [id number]  HPI  Pt is a 64 yo female with hx of T2DM, gastric bypass who presents to the clinic with lightheaded and groggy feeling yesterday. She noticed it about 30 minutes after eating 2 pancakes and syrup. She kinda saw white spots and has a dull headache. She ate some peanut butter and seemed to feel a little better. She did feel like her heart was racing a little.  No bowel changes. No fever, chills, body aches. No feelings like room spining or changing with positions. This is the first episode. No syncope.   .. Active Ambulatory Problems    Diagnosis Date Noted  . H/O gastric bypass 01/21/2014  . History of diabetes mellitus 01/21/2014  . Primary osteoarthritis of right hip 01/26/2014  . Primary osteoarthritis of both knees 02/17/2014  . Lipoma of shoulder 02/17/2014  . Nocturnal muscle cramps 05/28/2014  . Lumbar radiculopathy 09/21/2014  . RLS (restless legs syndrome) 09/21/2014  . Fatigue 09/21/2014  . Actinic keratosis due to exposure to sunlight 10/16/2014  . Low iron stores 10/16/2014  . Obesity 08/04/2015  . Depression, recurrent ([place]) 08/04/2015  . Hx of adenomatous colonic polyps 05/20/2016  . Family history of stomach cancer 03/01/2017  . Family history of colon cancer 03/01/2017  . Primary insomnia 03/01/2017  . Anxiety disorder 03/01/2017  . Gastroesophageal reflux disease with esophagitis 03/01/2017  . Stress at work 04/04/2017  . Achilles tendinitis of right lower extremity 08/29/2017  . Decreased diastolic blood pressure 46/27/0350  . Bradycardia 02/03/2018  . Atypical chest pain 02/03/2018  . Excessive daytime sleepiness 02/03/2018  . Vertigo, benign paroxysmal, bilateral 02/03/2018  . At risk for obstructive sleep apnea 02/03/2018  . Frontal headache 02/03/2018  . Vitamin D insufficiency 02/05/2018  . Low serum ferritin level 02/05/2018  . Lower back injury 11/08/2018    Resolved Ambulatory Problems    Diagnosis Date Noted  . Shingles outbreak 01/21/2014  . De Quervain's tenosynovitis, right 01/21/2014  . Mass of skin of right shoulder 01/21/2014  . Right wrist pain 01/26/2014  . Right leg pain 01/26/2014  . Solar lentigo 10/16/2014  . Breast pain, left 10/22/2014  . Cough 03/01/2017  . Class 1 obesity due to excess calories without serious comorbidity with body mass index (BMI) of 33.0 to 33.9 in adult 03/01/2017  . Grief reaction 03/01/2017  . Lipoma of right shoulder 03/01/2017   Past Medical History:  Diagnosis Date  . Depression   . Influenza   . Inverted nipple   . OA (osteoarthritis)       Review of Systems See HPI.     Objective:   Physical Exam Vitals reviewed.  Constitutional:      Appearance: She is obese.  HENT:     Head: Normocephalic.     Right Ear: Tympanic membrane, ear canal and external ear normal. There is no impacted cerumen.     Left Ear: Tympanic membrane, ear canal and external ear normal. There is no impacted cerumen.     Nose: Nose normal.     Mouth/Throat:     Mouth: Mucous membranes are moist.  Eyes:     Pupils: Pupils are equal, round, and reactive to light.  Cardiovascular:     Rate and Rhythm: Regular rhythm. Tachycardia present.  Pulmonary:     Effort: Pulmonary effort is normal.     Breath sounds: Normal breath sounds.  Neurological:     General: No focal deficit present.     Mental Status: She is alert and oriented to person, place, and time.     Cranial Nerves: No cranial nerve deficit.     Motor: No weakness.     Gait: Gait normal.     Comments: Marye Round negative for dizziness and nystagmus.   Psychiatric:        Mood and Affect: Mood normal.           Assessment & Plan:  Marland KitchenMarland KitchenTammy was seen today for dizziness.  Diagnoses and all orders for this visit:  Lightheaded -     POCT Glucose (CBG) -     CBC -     TSH -     methylPREDNISolone sodium succinate (SOLU-MEDROL) 125 mg/2  mL injection 125 mg  Depression, recurrent (HCC) -     buPROPion (WELLBUTRIN XL) 150 MG 24 hr tablet; Take 1 tablet (150 mg total) by mouth daily.  Hypoglycemia -     Hemoglobin A1c  Screening for lipid disorders -     Lipid Panel w/reflex Direct LDL  Vitamin D deficiency -     VITAMIN D 25 Hydroxy (Vit-D Deficiency, Fractures)  History of diabetes mellitus -     Hemoglobin A1c  Sinus headache -     methylPREDNISolone sodium succinate (SOLU-MEDROL) 125 mg/2 mL injection 125 mg  Racing heart beat  Other orders -     blood glucose meter kit and supplies KIT; Dispense based on patient and insurance preference. Use up to four times daily as directed. (FOR ICD-9 250.00, 250.01).   Borderline orthostatic changes in BP. Total with each position over 20 but change over time is 14 and 10. No signs of vertigo.   I am concerned this could be some low blood sugar. trandom sugar in office normal. Hx of DM. Given glucometer to check for future event. HO given to prevent. Discussed protein and sugars together. Make sure to stay hydrated.   She was having sinus/allergy headache. Solumedrol given IM today. Follow up as needed. No signs of infection.   Labs ordered to more evaluation.

## 2019-03-11 NOTE — Patient Instructions (Signed)
Preventing Hypoglycemia Hypoglycemia occurs when the level of sugar (glucose) in the blood is too low. Hypoglycemia can happen in people who do or do not have diabetes (diabetes mellitus). It can develop quickly, and it can be a medical emergency. For most people with diabetes, a blood glucose level below 70 mg/dL (3.9 mmol/L) is considered hypoglycemia. Glucose is a type of sugar that provides the body's main source of energy. Certain hormones (insulin and glucagon) control the level of glucose in the blood. Insulin lowers blood glucose, and glucagon increases blood glucose. Hypoglycemia can result from having too much insulin in the bloodstream, or from not eating enough food that contains glucose. Your risk for hypoglycemia is higher:  If you take insulin or diabetes medicines to help lower your blood glucose or help your body make more insulin.  If you skip or delay a meal or snack.  If you are ill.  During and after exercise. You can prevent hypoglycemia by working with your health care provider to adjust your meal plan as needed and by taking other precautions. How can hypoglycemia affect me? Mild symptoms Mild hypoglycemia may not cause any symptoms. If you do have symptoms, they may include:  Hunger.  Anxiety.  Sweating and feeling clammy.  Dizziness or feeling light-headed.  Sleepiness.  Nausea.  Increased heart rate.  Headache.  Blurry vision.  Irritability.  Tingling or numbness around the mouth, lips, or tongue.  A change in coordination.  Restless sleep. If mild hypoglycemia is not recognized and treated, it can quickly become moderate or severe hypoglycemia. Moderate symptoms Moderate hypoglycemia can cause:  Mental confusion and poor judgment.  Behavior changes.  Weakness.  Irregular heartbeat. Severe symptoms Severe hypoglycemia is a medical emergency. It can cause:  Fainting.  Seizures.  Loss of consciousness (coma).  Death. What  nutrition changes can be made?  Work with your health care provider or diet and nutrition specialist (dietitian) to make a healthy meal plan that is right for you. Follow your meal plan carefully.  Eat meals at regular times.  If recommended by your health care provider, have snacks between meals.  Donot skip or delay meals or snacks. You can be at risk for hypoglycemia if you are not getting enough carbohydrates. What lifestyle changes can be made?   Work closely with your health care provider to manage your blood glucose. Make sure you know: ? Your goal blood glucose levels. ? How and when to check your blood glucose. ? The symptoms of hypoglycemia. It is important to treat it right away to keep it from becoming severe.  Do not drink alcohol on an empty stomach.  When you are ill, check your blood glucose more often than usual. Follow your sick day plan whenever you cannot eat or drink normally. Make this plan in advance with your health care provider.  Always check your blood glucose before, during, and after exercise. How is this treated? This condition can often be treated by immediately eating or drinking something that contains sugar, such as:  Fruit juice, 4-6 oz (120-150 mL).  Regular (not diet) soda, 4-6 oz (120-150 mL).  Low-fat milk, 4 oz (120 mL).  Several pieces of hard candy.  Sugar or honey, 1 Tbsp (15 mL). Treating hypoglycemia if you have diabetes If you are alert and able to swallow safely, follow the 15:15 rule:  Take 15 grams of a rapid-acting carbohydrate. Talk with your health care provider about how much you should take.  Rapid-acting options  include: ? Glucose pills (take 15 grams). ? 6-8 pieces of hard candy. ? 4-6 oz (120-150 mL) of fruit juice. ? 4-6 oz (120-150 mL) of regular (not diet) soda.  Check your blood glucose 15 minutes after you take the carbohydrate.  If the repeat blood glucose level is still at or below 70 mg/dL (3.9 mmol/L),  take 15 grams of a carbohydrate again.  If your blood glucose level does not increase above 70 mg/dL (3.9 mmol/L) after 3 tries, seek emergency medical care.  After your blood glucose level returns to normal, eat a meal or a snack within 1 hour. Treating severe hypoglycemia Severe hypoglycemia is when your blood glucose level is at or below 54 mg/dL (3 mmol/L). Severe hypoglycemia is a medical emergency. Get medical help right away. If you have severe hypoglycemia and you cannot eat or drink, you may need an injection of glucagon. A family member or close friend should learn how to check your blood glucose and how to give you a glucagon injection. Ask your health care provider if you need to have an emergency glucagon injection kit available. Severe hypoglycemia may need to be treated in a hospital. The treatment may include getting glucose through an IV. You may also need treatment for the cause of your hypoglycemia. Where to find more information  American Diabetes Association: www.diabetes.[employer] of Diabetes and Digestive and Kidney Diseases: [employer] Contact a health care provider if:  You have problems keeping your blood glucose in your target range.  You have frequent episodes of hypoglycemia. Get help right away if:  You continue to have hypoglycemia symptoms after eating or drinking something containing glucose.  Your blood glucose level is at or below 54 mg/dL (3 mmol/L).  You faint.  You have a seizure. These symptoms may represent a serious problem that is an emergency. Do not wait to see if the symptoms will go away. Get medical help right away. Call your local emergency services (911 in the [place] Summary  Know the symptoms of hypoglycemia, and when you are at risk for it (such as during exercise or when you are sick). Check your blood glucose often when you are at risk for hypoglycemia.  Hypoglycemia can develop quickly, and it can be dangerous  if it is not treated right away. If you have a history of severe hypoglycemia, make sure you know how to use your glucagon injection kit.  Make sure you know how to treat hypoglycemia. Keep a carbohydrate snack available when you may be at risk for hypoglycemia. This information is not intended to replace advice given to you by your health care provider. Make sure you discuss any questions you have with your health care provider. Document Revised: 04/19/2018 Document Reviewed: 08/23/2016 Elsevier Patient Education  [place]. Hypoglycemia Hypoglycemia occurs when the level of sugar (glucose) in the blood is too low. Hypoglycemia can happen in people who do or do not have diabetes. It can develop quickly, and it can be a medical emergency. For most people with diabetes, a blood glucose level below 70 mg/dL (3.9 mmol/L) is considered hypoglycemia. Glucose is a type of sugar that provides the body's main source of energy. Certain hormones (insulin and glucagon) control the level of glucose in the blood. Insulin lowers blood glucose, and glucagon raises blood glucose. Hypoglycemia can result from having too much insulin in the bloodstream, or from not eating enough food that contains glucose. You may also have reactive hypoglycemia, which happens  within 4 hours after eating a meal. What are the causes? Hypoglycemia occurs most often in people who have diabetes and may be caused by:  Diabetes medicine.  Not eating enough, or not eating often enough.  Increased physical activity.  Drinking alcohol on an empty stomach. If you do not have diabetes, hypoglycemia may be caused by:  A tumor in the pancreas.  Not eating enough, or not eating for long periods at a time (fasting).  A severe infection or illness.  Certain medicines. What increases the risk? Hypoglycemia is more likely to develop in:  People who have diabetes and take medicines to lower blood glucose.  People who abuse  alcohol.  People who have a severe illness. What are the signs or symptoms? Mild symptoms Mild hypoglycemia may not cause any symptoms. If you do have symptoms, they may include:  Hunger.  Anxiety.  Sweating and feeling clammy.  Dizziness or feeling light-headed.  Sleepiness.  Nausea.  Increased heart rate.  Headache.  Blurry vision.  Irritability.  Tingling or numbness around the mouth, lips, or tongue.  A change in coordination.  Restless sleep. Moderate symptoms Moderate hypoglycemia can cause:  Mental confusion and poor judgment.  Behavior changes.  Weakness.  Irregular heartbeat. Severe symptoms Severe hypoglycemia is a medical emergency. It can cause:  Fainting.  Seizures.  Loss of consciousness (coma).  Death. How is this diagnosed? Hypoglycemia is diagnosed with a blood test to measure your blood glucose level. This blood test is done while you are having symptoms. Your health care provider may also do a physical exam and review your medical history. How is this treated? This condition can often be treated by immediately eating or drinking something that contains sugar, such as:  Fruit juice, 4-6 oz (120-150 mL).  Regular soda (not diet soda), 4-6 oz (120-150 mL).  Low-fat milk, 4 oz (120 mL).  Several pieces of hard candy.  Sugar or honey, 1 Tbsp (15 mL). Treating hypoglycemia if you have diabetes If you are alert and able to swallow safely, follow the 15:15 rule:  Take 15 grams of a rapid-acting carbohydrate. Talk with your health care provider about how much you should take.  Rapid-acting options include: ? Glucose pills (take 15 grams). ? 6-8 pieces of hard candy. ? 4-6 oz (120-150 mL) of fruit juice. ? 4-6 oz (120-150 mL) of regular (not diet) soda. ? 1 Tbsp (15 mL) honey or sugar.  Check your blood glucose 15 minutes after you take the carbohydrate.  If the repeat blood glucose level is still at or below 70 mg/dL (3.9  mmol/L), take 15 grams of a carbohydrate again.  If your blood glucose level does not increase above 70 mg/dL (3.9 mmol/L) after 3 tries, seek emergency medical care.  After your blood glucose level returns to normal, eat a meal or a snack within 1 hour.  Treating severe hypoglycemia Severe hypoglycemia is when your blood glucose level is at or below 54 mg/dL (3 mmol/L). Severe hypoglycemia is a medical emergency. Get medical help right away. If you have severe hypoglycemia and you cannot eat or drink, you may need an injection of glucagon. A family member or close friend should learn how to check your blood glucose and how to give you a glucagon injection. Ask your health care provider if you need to have an emergency glucagon injection kit available. Severe hypoglycemia may need to be treated in a hospital. The treatment may include getting glucose through an IV. You  may also need treatment for the cause of your hypoglycemia. Follow these instructions at home:  General instructions  Take over-the-counter and prescription medicines only as told by your health care provider.  Monitor your blood glucose as told by your health care provider.  Limit alcohol intake to no more than 1 drink a day for nonpregnant women and 2 drinks a day for men. One drink equals 12 oz of beer (355 mL), 5 oz of wine (148 mL), or 1 oz of hard liquor (44 mL).  Keep all follow-up visits as told by your health care provider. This is important. If you have diabetes:  Always have a rapid-acting carbohydrate snack with you to treat low blood glucose.  Follow your diabetes management plan as directed. Make sure you: ? Know the symptoms of hypoglycemia. It is important to treat it right away to prevent it from becoming severe. ? Take your medicines as directed. ? Follow your exercise plan. ? Follow your meal plan. Eat on time, and do not skip meals. ? Check your blood glucose as often as directed. Always check before  and after exercise. ? Follow your sick day plan whenever you cannot eat or drink normally. Make this plan in advance with your health care provider.  Share your diabetes management plan with people in your workplace, school, and household.  Check your urine for ketones when you are ill and as told by your health care provider.  Carry a medical alert card or wear medical alert jewelry. Contact a health care provider if:  You have problems keeping your blood glucose in your target range.  You have frequent episodes of hypoglycemia. Get help right away if:  You continue to have hypoglycemia symptoms after eating or drinking something containing glucose.  Your blood glucose is at or below 54 mg/dL (3 mmol/L).  You have a seizure.  You faint. These symptoms may represent a serious problem that is an emergency. Do not wait to see if the symptoms will go away. Get medical help right away. Call your local emergency services (911 in the [place] Summary  Hypoglycemia occurs when the level of sugar (glucose) in the blood is too low.  Hypoglycemia can happen in people who do or do not have diabetes. It can develop quickly, and it can be a medical emergency.  Make sure you know the symptoms of hypoglycemia and how to treat it.  Always have a rapid-acting carbohydrate snack with you to treat low blood sugar. This information is not intended to replace advice given to you by your health care provider. Make sure you discuss any questions you have with your health care provider. Document Revised: 06/18/2017 Document Reviewed: 01/29/2015 Elsevier Patient Education  2020 [employer].

## 2019-03-12 ENCOUNTER — Encounter: Payer: Self-pay | Admitting: Physician Assistant

## 2019-03-14 ENCOUNTER — Other Ambulatory Visit: Payer: Self-pay | Admitting: Physician Assistant

## 2019-03-14 DIAGNOSIS — I951 Orthostatic hypotension: Secondary | ICD-10-CM | POA: Insufficient documentation

## 2019-03-14 LAB — LIPID PANEL W/REFLEX DIRECT LDL
Cholesterol: 184 mg/dL (ref <200)
HDL: 73 mg/dL (ref >=50)
LDL Cholesterol (Calc): 92 mg/dL (calc)
Non-HDL Cholesterol (Calc): 111 mg/dL (calc) (ref <130)
Total CHOL/HDL Ratio: 2.5 (calc) (ref <5.0)
Triglycerides: 94 mg/dL (ref <150)

## 2019-03-14 LAB — CBC
HCT: 43.4 % (ref 35.0–45.0)
Hemoglobin: 13.8 g/dL (ref 11.7–15.5)
MCH: 26.2 pg — ABNORMAL LOW (ref 27.0–33.0)
MCHC: 31.8 g/dL — ABNORMAL LOW (ref 32.0–36.0)
MCV: 82.5 fL (ref 80.0–100.0)
MPV: 11.3 fL (ref 7.5–12.5)
Platelets: 177 10*3/uL (ref 140–400)
RBC: 5.26 10*6/uL — ABNORMAL HIGH (ref 3.80–5.10)
RDW: 14.1 % (ref 11.0–15.0)
WBC: 5.2 10*3/uL (ref 3.8–10.8)

## 2019-03-14 LAB — TSH: TSH: 4.17 mIU/L (ref 0.40–4.50)

## 2019-03-14 LAB — VITAMIN D 25 HYDROXY (VIT D DEFICIENCY, FRACTURES): Vit D, 25-Hydroxy: 17 ng/mL — ABNORMAL LOW (ref 30–100)

## 2019-03-14 LAB — HEMOGLOBIN A1C
Hgb A1c MFr Bld: 5.9 % of total Hgb — ABNORMAL HIGH (ref <5.7)
Mean Plasma Glucose: 123 (calc)
eAG (mmol/L): 6.8 (calc)

## 2019-03-14 MED ORDER — VITAMIN D (ERGOCALCIFEROL) 1.25 MG (50000 UNIT) PO CAPS: 50000.0000 [IU] | ORAL_CAPSULE | ORAL | 0 refills | Status: DC

## 2019-03-14 NOTE — Progress Notes (Signed)
Julia Larson,   A1C is 5.9. Pre-diabetes range but stable from 1 year ago.  TSHcontinues to be in normal range but upper limits of normal. Worth a recheck in 3 months to see if continues to trend up meaning a little less function of thyroid gland.  Cholesterol looks good.  Your vitamin D is very low. You need to be on high dose every week and taking 1000 units OTC. Recheck in 3 months.

## 2019-03-25 ENCOUNTER — Telehealth (INDEPENDENT_AMBULATORY_CARE_PROVIDER_SITE_OTHER): Payer: Managed Care, Other (non HMO) | Admitting: Physician Assistant

## 2019-03-25 ENCOUNTER — Encounter: Payer: Self-pay | Admitting: Physician Assistant

## 2019-03-25 VITALS — BP 136/83 | HR 92 | Temp 97.8°F | Ht 63.0 in | Wt 193.0 lb

## 2019-03-25 DIAGNOSIS — J0100 Acute maxillary sinusitis, unspecified: Secondary | ICD-10-CM

## 2019-03-25 MED ORDER — FLUTICASONE PROPIONATE 50 MCG/ACT NA SUSP: 2.0000 | Freq: Every day | NASAL | 6 refills | Status: DC

## 2019-03-25 MED ORDER — AMOXICILLIN-POT CLAVULANATE 875-125 MG PO TABS: 1.0000 | ORAL_TABLET | Freq: Two times a day (BID) | ORAL | 0 refills | Status: AC

## 2019-03-25 NOTE — Progress Notes (Signed)
Daughter/family have colds  Her symptoms started Friday Congestion Productive cough Sore throat Friday but has gone away  Has been taking Tylenol Cold and Sinus OTC

## 2019-03-26 NOTE — Progress Notes (Signed)
Patient ID: Julia Larson, female   DOB: 08/27/1955, 64 y.o.   MRN: [id number] .Marland KitchenVirtual Visit via Video Note  I connected with Julia Larson on 03/25/2019 at 11:30 AM EDT by a video enabled telemedicine application and verified that I am speaking with the correct person using two identifiers.  Location: Patient: home Provider: clinic   I discussed the limitations of evaluation and management by telemedicine and the availability of in person appointments. The patient expressed understanding and agreed to proceed.   History of Present Illness: Pt is a 64 yo female who calls into the clinic with sinus pressure, headache, fatigue, cough, sinus drainage for the last 5 days. Her daughters whole family has similar symptoms and tested negative for covid. She denies any loss of smell or taste, no wheezing, no GI symptoms. She is taking tylenol cold and sinus with little relief. No fever or body aches.   .. Active Ambulatory Problems    Diagnosis Date Noted  . H/O gastric bypass 01/21/2014  . History of diabetes mellitus 01/21/2014  . Primary osteoarthritis of right hip 01/26/2014  . Primary osteoarthritis of both knees 02/17/2014  . Lipoma of shoulder 02/17/2014  . Nocturnal muscle cramps 05/28/2014  . Lumbar radiculopathy 09/21/2014  . RLS (restless legs syndrome) 09/21/2014  . Fatigue 09/21/2014  . Actinic keratosis due to exposure to sunlight 10/16/2014  . Low iron stores 10/16/2014  . Obesity 08/04/2015  . Depression, recurrent ([place]) 08/04/2015  . Hx of adenomatous colonic polyps 05/20/2016  . Family history of stomach cancer 03/01/2017  . Family history of colon cancer 03/01/2017  . Primary insomnia 03/01/2017  . Anxiety disorder 03/01/2017  . Gastroesophageal reflux disease with esophagitis 03/01/2017  . Stress at work 04/04/2017  . Achilles tendinitis of right lower extremity 08/29/2017  . Decreased diastolic blood pressure [id number]  . Bradycardia 02/03/2018  . Atypical chest  pain 02/03/2018  . Excessive daytime sleepiness 02/03/2018  . Vertigo, benign paroxysmal, bilateral 02/03/2018  . At risk for obstructive sleep apnea 02/03/2018  . Frontal headache 02/03/2018  . Vitamin D deficiency 02/05/2018  . Low serum ferritin level 02/05/2018  . Lower back injury 11/08/2018  . Racing heart beat 03/11/2019  . Hypoglycemia 03/11/2019  . Lightheaded 03/11/2019  . Orthostatic hypotension 03/14/2019   Resolved Ambulatory Problems    Diagnosis Date Noted  . Shingles outbreak 01/21/2014  . De Quervain's tenosynovitis, right 01/21/2014  . Mass of skin of right shoulder 01/21/2014  . Right wrist pain 01/26/2014  . Right leg pain 01/26/2014  . Solar lentigo 10/16/2014  . Breast pain, left 10/22/2014  . Cough 03/01/2017  . Class 1 obesity due to excess calories without serious comorbidity with body mass index (BMI) of 33.0 to 33.9 in adult 03/01/2017  . Grief reaction 03/01/2017  . Lipoma of right shoulder 03/01/2017   Past Medical History:  Diagnosis Date  . Depression   . Influenza   . Inverted nipple   . OA (osteoarthritis)    Reviewed med, allergy, problem list.     Observations/Objective: No acute distress. Normal breathing.  No cough.   .. Today's Vitals   03/25/19 1126  BP: 136/83  Pulse: 92  Temp: 97.8 F (36.6 C)  TempSrc: Oral  Weight: 193 lb (87.5 kg)  Height: 5\' 3"  (1.6 m)   Body mass index is 34.19 kg/m.    Assessment and Plan: Marland KitchenMarland KitchenDiagnoses and all orders for this visit:  Acute non-recurrent maxillary sinusitis -     amoxicillin-clavulanate (AUGMENTIN) 875-125  MG tablet; Take 1 tablet by mouth 2 (two) times daily for 10 days. -     fluticasone (FLONASE) 50 MCG/ACT nasal spray; Place 2 sprays into both nostrils daily.   Treated with augmentin and flonase.work note given may go back in 24 hours. Low suspicion for covid. Follow up as needed.    Follow Up Instructions:    I discussed the assessment and treatment plan with the  patient. The patient was provided an opportunity to ask questions and all were answered. The patient agreed with the plan and demonstrated an understanding of the instructions.   The patient was advised to call back or seek an in-person evaluation if the symptoms worsen or if the condition fails to improve as anticipated.  I provided 11 minutes of non-face-to-face time during this encounter.   Iran Planas, PA-C

## 2019-08-18 ENCOUNTER — Encounter: Payer: Self-pay | Admitting: Osteopathic Medicine

## 2019-08-18 ENCOUNTER — Ambulatory Visit (INDEPENDENT_AMBULATORY_CARE_PROVIDER_SITE_OTHER): Payer: BC Managed Care – PPO | Admitting: Osteopathic Medicine

## 2019-08-18 VITALS — BP 117/83 | HR 71 | Wt 193.0 lb

## 2019-08-18 DIAGNOSIS — H8113 Benign paroxysmal vertigo, bilateral: Secondary | ICD-10-CM | POA: Diagnosis not present

## 2019-08-18 MED ORDER — MECLIZINE HCL 25 MG PO TABS: 25.0000 mg | ORAL_TABLET | Freq: Three times a day (TID) | ORAL | 0 refills | Status: DC | PRN

## 2019-08-18 NOTE — Progress Notes (Signed)
Julia Larson is a 64 y.o. female who presents to  [place] at [facility]  today, 08/18/19, seeking care for the following:  . Dizzy - concern for ear pressure. Feels ok when she's sitting down but worse w/ standing. Concerned may be d/t wax buildup or results of water in ear w/ recently swimming. Has had similar sensations in the past w/ spinning/unsteady feeling, went to PT for vestibular rehab   . On exam (+)Dix-Hallpike maneuver w/ reproduction of symptoms to L and lesser to R, no nystagmua, EOMI, PERRL, normal strentgth and speech, no CN deficit. TM and external canal normal BL        ASSESSMENT & PLAN with other pertinent findings:  The encounter diagnosis was BPPV (benign paroxysmal positional vertigo), bilateral.   Meds ordered this encounter  Medications  . meclizine (ANTIVERT) 25 MG tablet    Sig: Take 1-2 tablets (25-50 mg total) by mouth 3 (three) times daily as needed for dizziness.    Dispense:  30 tablet    Refill:  0   Home instructions for Eley maneuver.  Reassured that earwax / swimmer's ear will not cause this issue but BPPV can be recurrent No new tinnitus, hearing loss to concern for meniere's or other complicated cause Consider MRI / neuro referral if no better / worse    Patient Instructions  Vertigo Vertigo is the feeling that you or your surroundings are moving when they are not. This feeling can come and go at any time. Vertigo often goes away on its own. Vertigo can be dangerous if it occurs while you are doing something that could endanger you or others, such as driving or operating machinery. Your health care provider will do tests to determine the cause of your vertigo. Tests will also help your health care provider decide how best to treat your condition. Follow these instructions at home: Eating and drinking      Drink enough fluid to keep your urine pale yellow.  Do not drink  alcohol. Activity  Return to your normal activities as told by your health care provider. Ask your health care provider what activities are safe for you.  In the morning, first sit up on the side of the bed. When you feel okay, stand slowly while you hold onto something until you know that your balance is fine.  Move slowly. Avoid sudden body or head movements or certain positions, as told by your health care provider.  If you have trouble walking or keeping your balance, try using a cane for stability. If you feel dizzy or unstable, sit down right away.  Avoid doing any tasks that would cause danger to you or others if vertigo occurs.  Avoid bending down if you feel dizzy. Place items in your home so that they are easy for you to reach without leaning over.  Do not drive or use heavy machinery if you feel dizzy. General instructions  Take over-the-counter and prescription medicines only as told by your health care provider.  Keep all follow-up visits as told by your health care provider. This is important. Contact a health care provider if:  Your medicines do not relieve your vertigo or they make it worse.  You have a fever.  Your condition gets worse or you develop new symptoms.  Your family or friends notice any behavioral changes.  Your nausea or vomiting gets worse.  You have numbness or a prickling and tingling sensation in part of  your body. Get help right away if you:  Have difficulty moving or speaking.  Are always dizzy.  Faint.  Develop severe headaches.  Have weakness in your hands, arms, or legs.  Have changes in your hearing or vision.  Develop a stiff neck.  Develop sensitivity to light. Summary  Vertigo is the feeling that you or your surroundings are moving when they are not.  Your health care provider will do tests to determine the cause of your vertigo.  Follow instructions for home care. You may be told to avoid certain tasks, positions, or  movements.  Contact a health care provider if your medicines do not relieve your symptoms, or if you have a fever, nausea, vomiting, or changes in behavior.  Get help right away if you have severe headaches or difficulty speaking, or you develop hearing or vision problems. This information is not intended to replace advice given to you by your health care provider. Make sure you discuss any questions you have with your health care provider. Document Revised: 11/19/2017 Document Reviewed: 11/19/2017 Elsevier Patient Education  [place].    No orders of the defined types were placed in this encounter.       Follow-up instructions: Return if symptoms worsen or fail to improve.                                         BP 117/83 (BP Location: Right Arm, Patient Position: Sitting)   Pulse 71   Wt 193 lb (87.5 kg)   SpO2 99%   BMI 34.19 kg/m   Current Meds  Medication Sig  . acetaminophen (TYLENOL) 650 MG CR tablet Take 2 tablets (1,300 mg total) by mouth every 8 (eight) hours as needed for pain.  . blood glucose meter kit and supplies KIT Dispense based on patient and insurance preference. Use up to four times daily as directed. (FOR ICD-9 250.00, 250.01).  Marland Kitchen buPROPion (WELLBUTRIN XL) 150 MG 24 hr tablet Take 1 tablet (150 mg total) by mouth daily.  Marland Kitchen levocetirizine (XYZAL) 5 MG tablet Take 1 tablet (5 mg total) by mouth every evening.  . Multiple Vitamin (MULTIVITAMIN) tablet Take 1 tablet by mouth daily.    No results found for this or any previous visit (from the past 72 hour(s)).  No results found.     All questions at time of visit were answered - patient instructed to contact office with any additional concerns or updates.  ER/RTC precautions were reviewed with the patient as applicable.   Please note: voice recognition software was used to produce this document, and typos may escape review. Please contact Dr. Sheppard Coil for  any needed clarifications.   Total encounter time: 30 minutes.

## 2019-08-18 NOTE — Patient Instructions (Signed)

## 2019-08-19 ENCOUNTER — Telehealth: Payer: Self-pay

## 2019-08-19 NOTE — Telephone Encounter (Signed)
Patient was in office last week with vertigo symptoms. Meclizine was prescribed, the patient says the medication is causing nausea and queasiness and she is still having vertigo symptoms after starting the medications.

## 2019-08-21 NOTE — Telephone Encounter (Signed)
Patient states vertigo is getting better. Only happens now when she moves quickly. She has been doing epley maneuvers. Will hold on referral to vestibular rehab for now.

## 2019-08-21 NOTE — Telephone Encounter (Signed)
Has she been doing epley manuevers? If so consider vestibular rehab PT.

## 2019-08-28 ENCOUNTER — Telehealth: Payer: Self-pay

## 2019-08-28 DIAGNOSIS — R300 Dysuria: Secondary | ICD-10-CM

## 2019-08-28 NOTE — Telephone Encounter (Signed)
Pt called stating she has an UTI. Symptoms started on 08/27/19. Currently in [place] visiting her sister (hasn't seen her in 2 yrs). Requesting if provider can send in an antibiotic into [facility] located in [place], [place]. Preferred pharmacy updated in patient's chart.

## 2019-08-29 MED ORDER — CEPHALEXIN 500 MG PO CAPS: 500.0000 mg | ORAL_CAPSULE | Freq: Two times a day (BID) | ORAL | 0 refills | Status: DC

## 2019-08-29 NOTE — Telephone Encounter (Signed)
Sent keflex. Follow up in [facility] or here if symptoms not improving or worsening.

## 2019-08-29 NOTE — Telephone Encounter (Signed)
Task completed. Pt has been updated of antibiotic rx sent to preferred pharmacy. Aware of provider's recommendation.

## 2019-09-03 ENCOUNTER — Ambulatory Visit: Payer: BC Managed Care – PPO | Admitting: Physician Assistant

## 2019-09-21 ENCOUNTER — Encounter: Payer: Self-pay | Admitting: Physician Assistant

## 2019-09-22 NOTE — Telephone Encounter (Signed)
Last Tdap Feb 2019. Okay to wait til Wednesday for you to look at it?

## 2019-09-24 ENCOUNTER — Ambulatory Visit (INDEPENDENT_AMBULATORY_CARE_PROVIDER_SITE_OTHER): Payer: BC Managed Care – PPO | Admitting: Physician Assistant

## 2019-09-24 VITALS — BP 128/52 | HR 66 | Ht 63.0 in | Wt 194.0 lb

## 2019-09-24 DIAGNOSIS — R238 Other skin changes: Secondary | ICD-10-CM

## 2019-09-24 DIAGNOSIS — Z0000 Encounter for general adult medical examination without abnormal findings: Secondary | ICD-10-CM

## 2019-09-24 DIAGNOSIS — R233 Spontaneous ecchymoses: Secondary | ICD-10-CM | POA: Insufficient documentation

## 2019-09-24 DIAGNOSIS — E559 Vitamin D deficiency, unspecified: Secondary | ICD-10-CM

## 2019-09-24 DIAGNOSIS — S0101XA Laceration without foreign body of scalp, initial encounter: Secondary | ICD-10-CM | POA: Diagnosis not present

## 2019-09-24 DIAGNOSIS — Z23 Encounter for immunization: Secondary | ICD-10-CM

## 2019-09-24 DIAGNOSIS — R7989 Other specified abnormal findings of blood chemistry: Secondary | ICD-10-CM

## 2019-09-24 DIAGNOSIS — Z1159 Encounter for screening for other viral diseases: Secondary | ICD-10-CM

## 2019-09-24 DIAGNOSIS — R7303 Prediabetes: Secondary | ICD-10-CM

## 2019-09-24 DIAGNOSIS — T148XXA Other injury of unspecified body region, initial encounter: Secondary | ICD-10-CM

## 2019-09-24 DIAGNOSIS — Z1231 Encounter for screening mammogram for malignant neoplasm of breast: Secondary | ICD-10-CM | POA: Diagnosis not present

## 2019-09-24 MED ORDER — TRIAMCINOLONE ACETONIDE 0.1 % EX CREA: 1.0000 "application " | TOPICAL_CREAM | Freq: Two times a day (BID) | CUTANEOUS | 0 refills | Status: AC

## 2019-09-24 NOTE — Patient Instructions (Signed)
Health Maintenance, Female Adopting a healthy lifestyle and getting preventive care are important in promoting health and wellness. Ask your health care provider about:  The right schedule for you to have regular tests and exams.  Things you can do on your own to prevent diseases and keep yourself healthy. What should I know about diet, weight, and exercise? Eat a healthy diet   Eat a diet that includes plenty of vegetables, fruits, low-fat dairy products, and lean protein.  Do not eat a lot of foods that are high in solid fats, added sugars, or sodium. Maintain a healthy weight Body mass index (BMI) is used to identify weight problems. It estimates body fat based on height and weight. Your health care provider can help determine your BMI and help you achieve or maintain a healthy weight. Get regular exercise Get regular exercise. This is one of the most important things you can do for your health. Most adults should:  Exercise for at least 150 minutes each week. The exercise should increase your heart rate and make you sweat (moderate-intensity exercise).  Do strengthening exercises at least twice a week. This is in addition to the moderate-intensity exercise.  Spend less time sitting. Even light physical activity can be beneficial. Watch cholesterol and blood lipids Have your blood tested for lipids and cholesterol at 62 years of age, then have this test every 5 years. Have your cholesterol levels checked more often if:  Your lipid or cholesterol levels are high.  You are older than 62 years of age.  You are at high risk for heart disease. What should I know about cancer screening? Depending on your health history and family history, you may need to have cancer screening at various ages. This may include screening for:  Breast cancer.  Cervical cancer.  Colorectal cancer.  Skin cancer.  Lung cancer. What should I know about heart disease, diabetes, and high blood  pressure? Blood pressure and heart disease  High blood pressure causes heart disease and increases the risk of stroke. This is more likely to develop in people who have high blood pressure readings, are of African descent, or are overweight.  Have your blood pressure checked: ? Every 3-5 years if you are 18-39 years of age. ? Every year if you are 40 years old or older. Diabetes Have regular diabetes screenings. This checks your fasting blood sugar level. Have the screening done:  Once every three years after age 40 if you are at a normal weight and have a low risk for diabetes.  More often and at a younger age if you are overweight or have a high risk for diabetes. What should I know about preventing infection? Hepatitis B If you have a higher risk for hepatitis B, you should be screened for this virus. Talk with your health care provider to find out if you are at risk for hepatitis B infection. Hepatitis C Testing is recommended for:  Everyone born from 1945 through 1965.  Anyone with known risk factors for hepatitis C. Sexually transmitted infections (STIs)  Get screened for STIs, including gonorrhea and chlamydia, if: ? You are sexually active and are younger than 62 years of age. ? You are older than 62 years of age and your health care provider tells you that you are at risk for this type of infection. ? Your sexual activity has changed since you were last screened, and you are at increased risk for chlamydia or gonorrhea. Ask your health care provider if   you are at risk.  Ask your health care provider about whether you are at high risk for HIV. Your health care provider may recommend a prescription medicine to help prevent HIV infection. If you choose to take medicine to prevent HIV, you should first get tested for HIV. You should then be tested every 3 months for as long as you are taking the medicine. Pregnancy  If you are about to stop having your period (premenopausal) and  you may become pregnant, seek counseling before you get pregnant.  Take 400 to 800 micrograms (mcg) of folic acid every day if you become pregnant.  Ask for birth control (contraception) if you want to prevent pregnancy. Osteoporosis and menopause Osteoporosis is a disease in which the bones lose minerals and strength with aging. This can result in bone fractures. If you are 65 years old or older, or if you are at risk for osteoporosis and fractures, ask your health care provider if you should:  Be screened for bone loss.  Take a calcium or vitamin D supplement to lower your risk of fractures.  Be given hormone replacement therapy (HRT) to treat symptoms of menopause. Follow these instructions at home: Lifestyle  Do not use any products that contain nicotine or tobacco, such as cigarettes, e-cigarettes, and chewing tobacco. If you need help quitting, ask your health care provider.  Do not use street drugs.  Do not share needles.  Ask your health care provider for help if you need support or information about quitting drugs. Alcohol use  Do not drink alcohol if: ? Your health care provider tells you not to drink. ? You are pregnant, may be pregnant, or are planning to become pregnant.  If you drink alcohol: ? Limit how much you use to 0-1 drink a day. ? Limit intake if you are breastfeeding.  Be aware of how much alcohol is in your drink. In the [place] one drink equals one 12 oz bottle of beer (355 mL), one 5 oz glass of wine (148 mL), or one 1 oz glass of hard liquor (44 mL). General instructions  Schedule regular health, dental, and eye exams.  Stay current with your vaccines.  Tell your health care provider if: ? You often feel depressed. ? You have ever been abused or do not feel safe at home. Summary  Adopting a healthy lifestyle and getting preventive care are important in promoting health and wellness.  Follow your health care provider's instructions about healthy  diet, exercising, and getting tested or screened for diseases.  Follow your health care provider's instructions on monitoring your cholesterol and blood pressure. This information is not intended to replace advice given to you by your health care provider. Make sure you discuss any questions you have with your health care provider. Document Revised: 12/19/2017 Document Reviewed: 12/19/2017 Elsevier Patient Education  2020 Elsevier Inc.  

## 2019-09-24 NOTE — Progress Notes (Signed)
Subjective:     Julia Larson is a 64 y.o. female and is here for a comprehensive physical exam. The patient reports problems - see below.    Pt complains of easy bruising and large bruise on left calf after hiking and camping this weekend. Not painful and no swelling. Not taken anything. She also 2 days ago hit her head on toliet paper holder. She bled for a while and has a headache. No LOC.   Social History   Socioeconomic History  . Marital status: Single    Spouse name: Not on file  . Number of children: 2  . Years of education: 12+  . Highest education level: Not on file  Occupational History  . Occupation:  voc. rehab  Tobacco Use  . Smoking status: Former Smoker    Quit date: 11/09/1985    Years since quitting: 33.9  . Smokeless tobacco: Never Used  Substance and Sexual Activity  . Alcohol use: Yes    Alcohol/week: 0.0 standard drinks    Comment: 1-2 glasses per week (09/21/14)  . Drug use: No  . Sexual activity: Not Currently  Other Topics Concern  . Not on file  Social History Narrative   She is divorced     is employed in Data processing manager support   Lives at home with brother and sister-in-law   Caffeine use: 1-2 cups per day   Social Determinants of Health   Financial Resource Strain:   . Difficulty of Paying Living Expenses: Not on file  Food Insecurity:   . Worried About Charity fundraiser in the Last Year: Not on file  . Ran Out of Food in the Last Year: Not on file  Transportation Needs:   . Lack of Transportation (Medical): Not on file  . Lack of Transportation (Non-Medical): Not on file  Physical Activity:   . Days of Exercise per Week: Not on file  . Minutes of Exercise per Session: Not on file  Stress:   . Feeling of Stress : Not on file  Social Connections:   . Frequency of Communication with Friends and Family: Not on file  . Frequency of Social Gatherings with Friends and Family: Not on file  . Attends Religious Services: Not on file   . Active Member of Clubs or Organizations: Not on file  . Attends Archivist Meetings: Not on file  . Marital Status: Not on file  Intimate Partner Violence:   . Fear of Current or Ex-Partner: Not on file  . Emotionally Abused: Not on file  . Physically Abused: Not on file  . Sexually Abused: Not on file   Health Maintenance  Topic Date Due  . HIV Screening  Never done  . COLONOSCOPY  05/15/2021  . MAMMOGRAM  10/01/2021  . TETANUS/TDAP  02/28/2027  . INFLUENZA VACCINE  Completed  . COVID-19 Vaccine  Completed  . Hepatitis C Screening  Completed    The following portions of the patient's history were reviewed and updated as appropriate: allergies, current medications, past family history, past medical history, past social history, past surgical history and problem list.  Review of Systems Pertinent items noted in HPI and remainder of comprehensive ROS otherwise negative.   Objective:    BP (!) 128/52   Pulse 66   Ht 5\' 3"  (1.6 m)   Wt 194 lb (88 kg)   SpO2 99%   BMI 34.37 kg/m  General appearance: alert, cooperative, appears stated age and moderately obese Head: Normocephalic,  without obvious abnormality, scalp contusion superficial laceration to left frontal parietal lobe.  Eyes: conjunctivae/corneas clear. PERRL, EOM's intact. Fundi benign. Ears: normal TM's and external ear canals both ears Nose: Nares normal. Septum midline. Mucosa normal. No drainage or sinus tenderness. Throat: lips, mucosa, and tongue normal; teeth and gums normal Neck: no adenopathy, no carotid bruit, no JVD, supple, symmetrical, trachea midline and thyroid not enlarged, symmetric, no tenderness/mass/nodules Back: symmetric, no curvature. ROM normal. No CVA tenderness. Lungs: clear to auscultation bilaterally Heart: regular rate and rhythm, S1, S2 normal, no murmur, click, rub or gallop Abdomen: soft, non-tender; bowel sounds normal; no masses,  no organomegaly Extremities: extremities  normal, atraumatic, no cyanosis or edema negative homans.  Pulses: 2+ and symmetric Skin: Skin color, texture, turgor normal. No rashes or lesions or large bruise posterior left calf 4cm by 5cm. not warm or tender.  18.5 and 19 inches R and L.  Lymph nodes: Cervical, supraclavicular, and axillary nodes normal. Neurologic: Grossly normal    .. Depression screen [facility] 2/9 09/24/2019 11/06/2018 02/27/2017  Decreased Interest 0 2 2  Down, Depressed, Hopeless 0 2 2  PHQ - 2 Score 0 4 4  Altered sleeping 0 3 3  Tired, decreased energy 1 2 2   Change in appetite 0 2 2  Feeling bad or failure about yourself  1 3 0  Trouble concentrating 0 2 1  Moving slowly or fidgety/restless 0 0 0  Suicidal thoughts 0 0 0  PHQ-9 Score 2 16 12   Difficult doing work/chores Not difficult at all Somewhat difficult Somewhat difficult   .Marland Kitchen GAD 7 : Generalized Anxiety Score 09/24/2019 11/06/2018  Nervous, Anxious, on Edge 0 2  Control/stop worrying 1 2  Worry too much - different things 1 2  Trouble relaxing 0 2  Restless 0 0  Easily annoyed or irritable 0 0  Afraid - awful might happen 0 1  Total GAD 7 Score 2 9  Anxiety Difficulty Not difficult at all Somewhat difficult     Assessment:    Healthy female exam.      Plan:    Marland KitchenMarland KitchenTammy was seen today for annual exam.  Diagnoses and all orders for this visit:  Routine physical examination -     Hepatitis C Antibody -     Hemoglobin A1c -     VITAMIN D 25 Hydroxy (Vit-D Deficiency, Fractures) -     TSH -     T4, free -     CBC w/Diff/Platelet -     Protime-INR  Flu vaccine need -     Flu Vaccine QUAD 36+ mos IM  Encounter for screening mammogram for malignant neoplasm of breast -     MM 3D SCREEN BREAST BILATERAL; Future  Laceration of scalp without foreign body, initial encounter  Easy bruising -     CBC w/Diff/Platelet -     Protime-INR  Encounter for hepatitis C screening test for low risk patient -     Hepatitis C  Antibody  Prediabetes -     Hemoglobin A1c  Vitamin D deficiency -     VITAMIN D 25 Hydroxy (Vit-D Deficiency, Fractures)  Elevated TSH -     TSH -     T4, free  Other orders -     triamcinolone cream (KENALOG) 0.1 %; Apply 1 application topically 2 (two) times daily. To external ear as needed.   .. Discussed 150 minutes of exercise a week.  Encouraged vitamin D 1000 units and Calcium  1300mg  or 4 servings of dairy a day.  Fasting labs ordered. PHQ and GAD numbers are improved Mammogram up-to-date Colonoscopy up-to-date Covid vaccine done Needs to consider shingles vaccine.   Hematoma/bruise of left calf. No signs of DVT. Hx of camping and injury. Negative homans.calf size the same bilaterally. Ice and compress.  Laceration healing well of head. Ice and tylenol as needed.   See After Visit Summary for Counseling Recommendations

## 2019-09-25 LAB — CBC WITH DIFFERENTIAL/PLATELET
Absolute Monocytes: 281 cells/uL (ref 200–950)
Basophils Absolute: 42 cells/uL (ref 0–200)
Basophils Relative: 1.1 %
Eosinophils Absolute: 91 cells/uL (ref 15–500)
Eosinophils Relative: 2.4 %
HCT: 43.2 % (ref 35.0–45.0)
Hemoglobin: 13.8 g/dL (ref 11.7–15.5)
Lymphs Abs: 965 cells/uL (ref 850–3900)
MCH: 26.7 pg — ABNORMAL LOW (ref 27.0–33.0)
MCHC: 31.9 g/dL — ABNORMAL LOW (ref 32.0–36.0)
MCV: 83.6 fL (ref 80.0–100.0)
MPV: 11 fL (ref 7.5–12.5)
Monocytes Relative: 7.4 %
Neutro Abs: 2421 cells/uL (ref 1500–7800)
Neutrophils Relative %: 63.7 %
Platelets: 184 10*3/uL (ref 140–400)
RBC: 5.17 10*6/uL — ABNORMAL HIGH (ref 3.80–5.10)
RDW: 14.1 % (ref 11.0–15.0)
Total Lymphocyte: 25.4 %
WBC: 3.8 10*3/uL (ref 3.8–10.8)

## 2019-09-25 LAB — T4, FREE: Free T4: 1.1 ng/dL (ref 0.8–1.8)

## 2019-09-25 LAB — HEMOGLOBIN A1C
Hgb A1c MFr Bld: 5.8 % of total Hgb — ABNORMAL HIGH (ref <5.7)
Mean Plasma Glucose: 120 (calc)
eAG (mmol/L): 6.6 (calc)

## 2019-09-25 LAB — TSH: TSH: 2.66 mIU/L (ref 0.40–4.50)

## 2019-09-25 LAB — PROTIME-INR
INR: 1
Prothrombin Time: 10.9 s (ref 9.0–11.5)

## 2019-09-25 LAB — HEPATITIS C ANTIBODY
Hepatitis C Ab: NONREACTIVE
SIGNAL TO CUT-OFF: 0 (ref <1.00)

## 2019-09-25 LAB — VITAMIN D 25 HYDROXY (VIT D DEFICIENCY, FRACTURES): Vit D, 25-Hydroxy: 32 ng/mL (ref 30–100)

## 2019-09-26 ENCOUNTER — Encounter: Payer: Self-pay | Admitting: Physician Assistant

## 2019-09-26 NOTE — Progress Notes (Signed)
Julia Larson,   Pre-diabetes but stable and even gone down just a tad.  Vitamin D much better than 6 months ago. Now in normal range. How much are you taking right now? I would like to increase a little to get you to the 50 mark.  Thyroid perfect.  Platelets are on the low side of normal but still in normal range but could make bruising a little more easy.  INR is great.  RBC increased a little but hemoglobin and hematocrit are both normal. No concerns.

## 2019-09-30 ENCOUNTER — Other Ambulatory Visit (HOSPITAL_BASED_OUTPATIENT_CLINIC_OR_DEPARTMENT_OTHER): Payer: Self-pay | Admitting: Physician Assistant

## 2019-09-30 DIAGNOSIS — Z1231 Encounter for screening mammogram for malignant neoplasm of breast: Secondary | ICD-10-CM

## 2019-10-02 ENCOUNTER — Other Ambulatory Visit: Payer: Self-pay

## 2019-10-02 ENCOUNTER — Ambulatory Visit (INDEPENDENT_AMBULATORY_CARE_PROVIDER_SITE_OTHER): Payer: BC Managed Care – PPO

## 2019-10-02 DIAGNOSIS — Z1231 Encounter for screening mammogram for malignant neoplasm of breast: Secondary | ICD-10-CM | POA: Diagnosis not present

## 2019-10-06 NOTE — Progress Notes (Signed)
Julia Larson,   Normal mammogram. Follow up in 1 year.

## 2019-10-07 ENCOUNTER — Encounter: Payer: Self-pay | Admitting: Physician Assistant

## 2019-10-10 ENCOUNTER — Ambulatory Visit: Payer: BC Managed Care – PPO | Admitting: Physician Assistant

## 2019-10-13 ENCOUNTER — Telehealth: Payer: Self-pay | Admitting: Neurology

## 2019-10-13 ENCOUNTER — Encounter: Payer: Self-pay | Admitting: Physician Assistant

## 2019-10-13 DIAGNOSIS — R7303 Prediabetes: Secondary | ICD-10-CM | POA: Insufficient documentation

## 2019-10-13 DIAGNOSIS — R7989 Other specified abnormal findings of blood chemistry: Secondary | ICD-10-CM | POA: Insufficient documentation

## 2019-10-13 DIAGNOSIS — F339 Major depressive disorder, recurrent, unspecified: Secondary | ICD-10-CM

## 2019-10-13 DIAGNOSIS — T148XXA Other injury of unspecified body region, initial encounter: Secondary | ICD-10-CM | POA: Insufficient documentation

## 2019-10-13 NOTE — Telephone Encounter (Signed)
Patient left vm stating she lost her job Friday and wanted to get a 90 day supply of Wellbutrin before her insurance is cancelled. Let her know 1 year was sent to [facility] in March. She has not filled since June. Advised her to call them and have them fill for her. Will call with any other issues.

## 2019-10-14 ENCOUNTER — Ambulatory Visit: Payer: BC Managed Care – PPO | Admitting: Physician Assistant

## 2019-10-15 ENCOUNTER — Other Ambulatory Visit: Payer: Self-pay

## 2019-10-15 DIAGNOSIS — F339 Major depressive disorder, recurrent, unspecified: Secondary | ICD-10-CM

## 2019-10-15 MED ORDER — BUPROPION HCL ER (XL) 150 MG PO TB24: 150.0000 mg | ORAL_TABLET | Freq: Every day | ORAL | 1 refills | Status: DC

## 2019-10-15 NOTE — Addendum Note (Signed)
Addended byAnnamaria Helling on: 10/15/2019 08:57 AM   Modules accepted: Orders

## 2020-01-18 ENCOUNTER — Encounter: Payer: Self-pay | Admitting: Physician Assistant

## 2020-02-13 ENCOUNTER — Encounter: Payer: Self-pay | Admitting: Physician Assistant

## 2020-04-03 IMAGING — DX DG LUMBAR SPINE COMPLETE 4+V
6 series · 6 of 6 positions shown · non-contrast
Comparison: 04/30/2014

CLINICAL DATA: Low back pain following injury yesterday, initial
encounter

EXAM:
LUMBAR SPINE - COMPLETE 4+ VIEW

[l-spine ap]
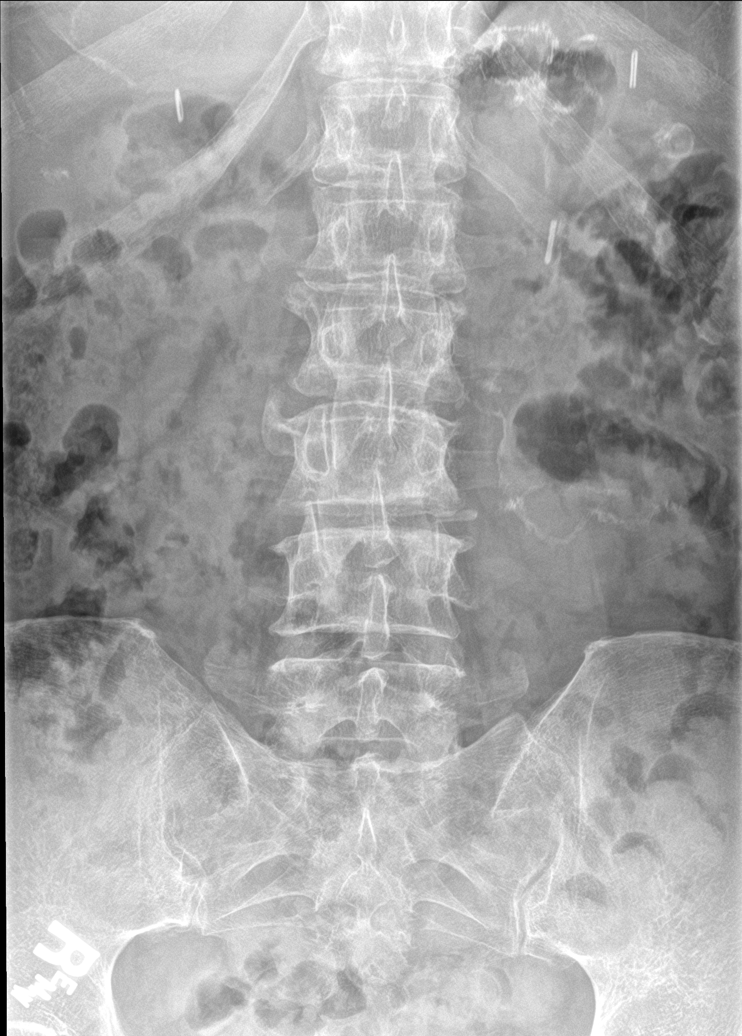

[l-spine obl (1 of 2)]
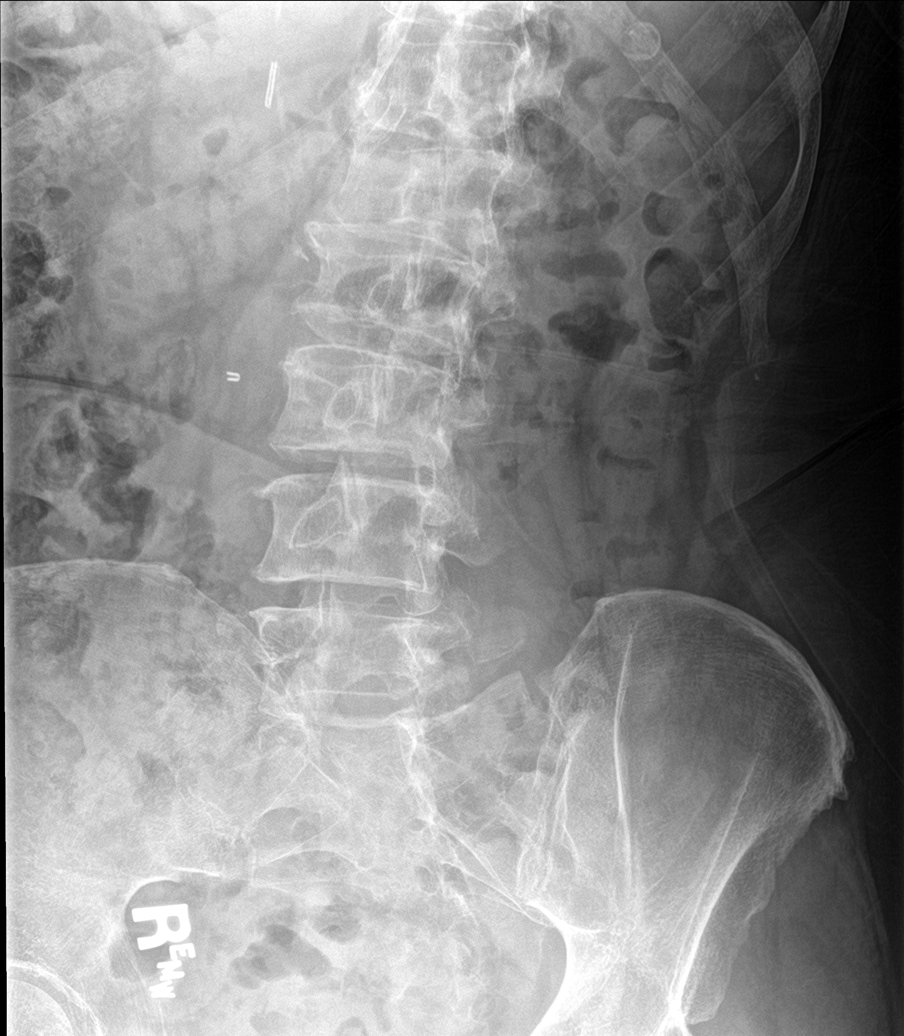

[l-spine obl (2 of 2)]
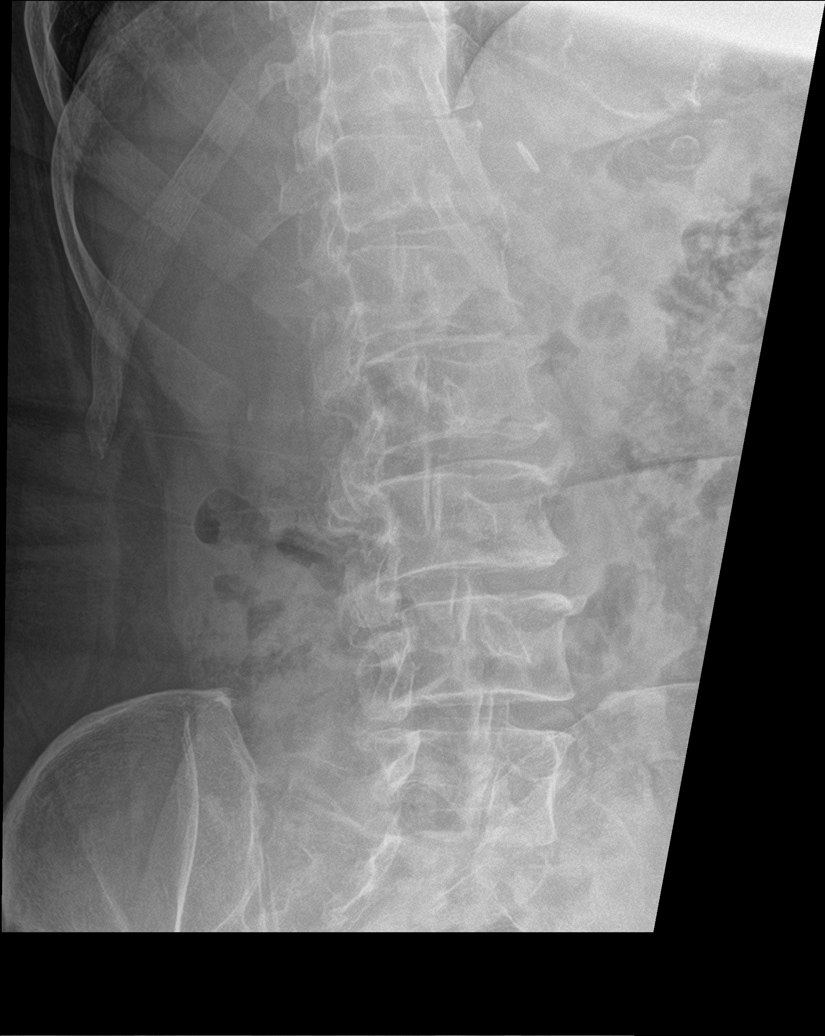

[l-spine lat]
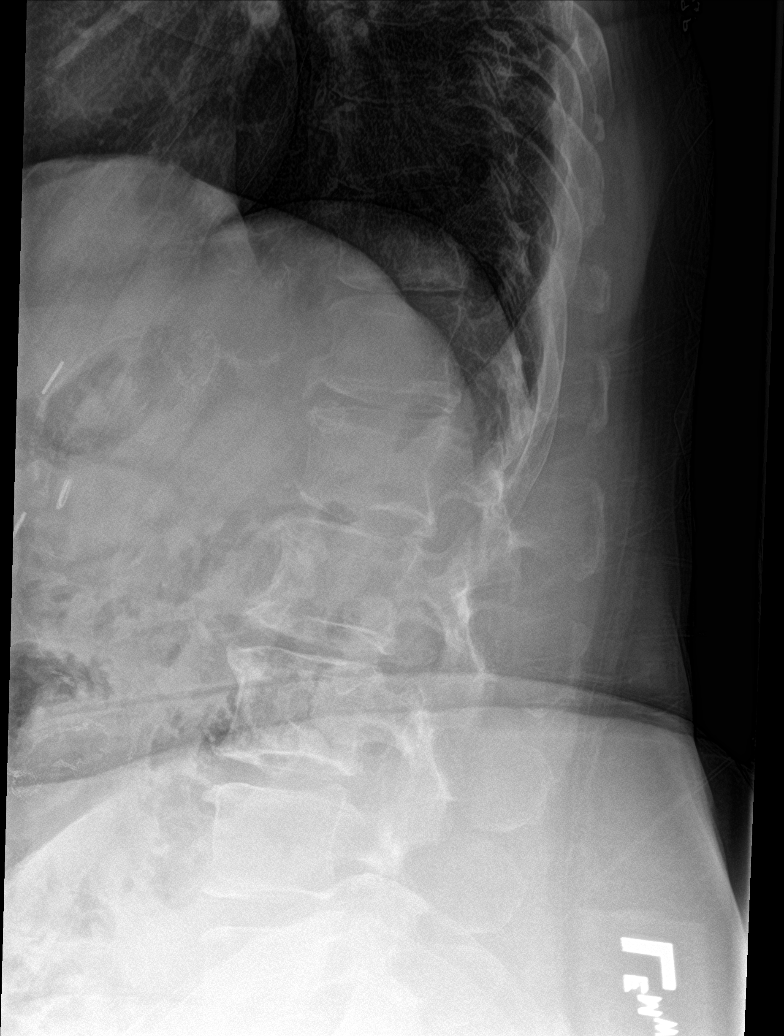

[l-spine spot (1 of 2)]
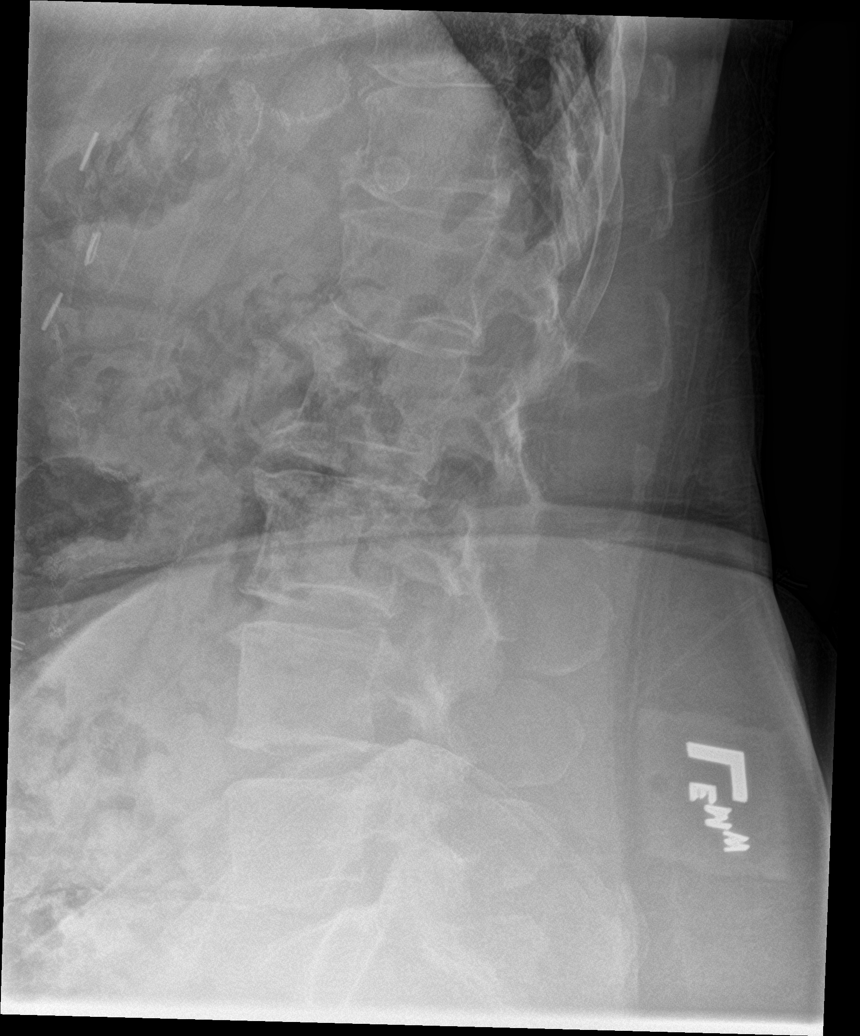

[l-spine spot (2 of 2)]
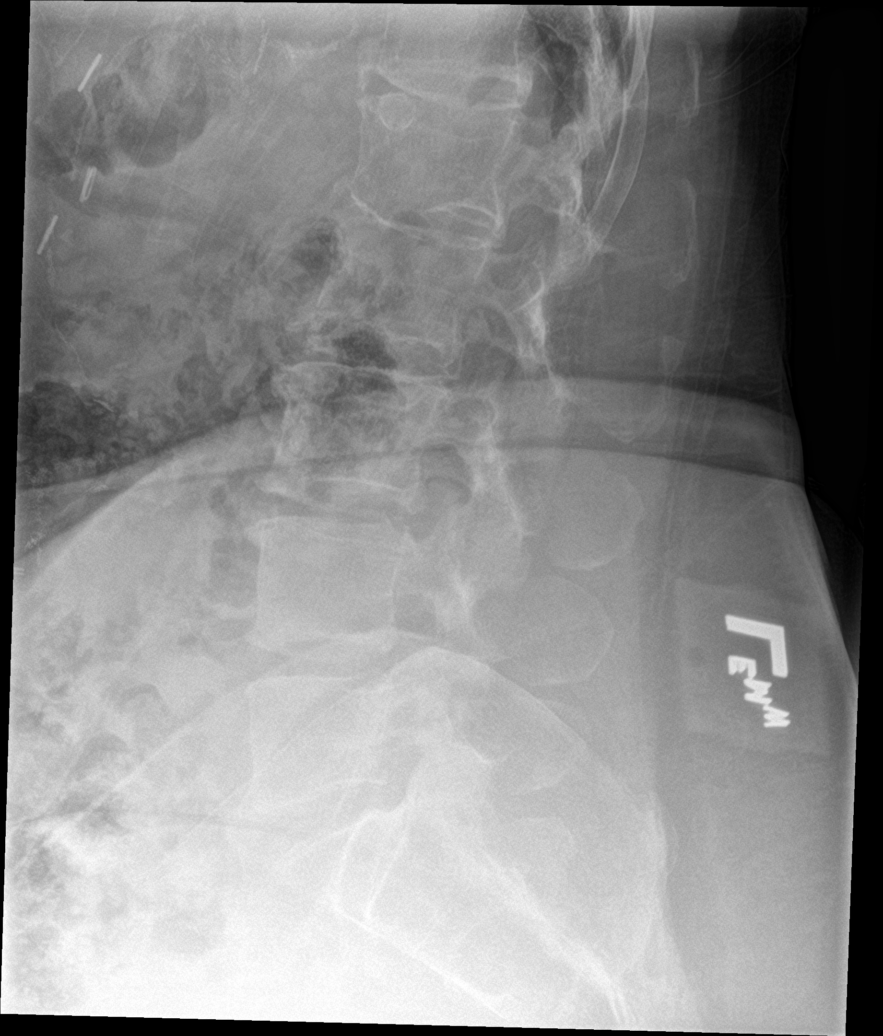

[6 of 6 positions shown; findings below may reference images not displayed]

FINDINGS: Five lumbar type vertebral bodies are well visualized. Vertebral
body height is well maintained. Mild osteophytic changes are seen.
No anterolisthesis is seen. Facet hypertrophic changes are noted as
well.
IMPRESSION: Multilevel osteophytic changes without acute abnormality.

## 2020-09-06 ENCOUNTER — Other Ambulatory Visit: Payer: Self-pay | Admitting: Physician Assistant

## 2020-09-06 DIAGNOSIS — F339 Major depressive disorder, recurrent, unspecified: Secondary | ICD-10-CM

## 2020-09-20 ENCOUNTER — Other Ambulatory Visit: Payer: Self-pay | Admitting: Physician Assistant

## 2020-09-20 DIAGNOSIS — Z1231 Encounter for screening mammogram for malignant neoplasm of breast: Secondary | ICD-10-CM

## 2020-09-23 ENCOUNTER — Other Ambulatory Visit: Payer: Self-pay | Admitting: Physician Assistant

## 2020-09-23 DIAGNOSIS — F339 Major depressive disorder, recurrent, unspecified: Secondary | ICD-10-CM

## 2022-03-29 ENCOUNTER — Telehealth: Payer: Self-pay | Admitting: Physician Assistant

## 2022-03-29 NOTE — Telephone Encounter (Signed)
Patient called she would like to move her care back to you from the [place] thru [facility] she hasn't been seen since 2021 please advise

## 2022-03-29 NOTE — Telephone Encounter (Signed)
Ok

## 2022-03-30 ENCOUNTER — Telehealth: Payer: Self-pay | Admitting: General Practice

## 2022-03-30 NOTE — Transitions of Care (Post Inpatient/ED Visit) (Signed)
   03/30/2022  Name: Julia Larson MRN: [id number] DOB: 08/27/1955  Today's TOC FU Call Status: Today's TOC FU Call Status:: Unsuccessul Call (1st Attempt) Unsuccessful Call (1st Attempt) Date: 03/30/22  Attempted to reach the patient regarding the most recent Inpatient/ED visit.  Follow Up Plan: Additional outreach attempts will be made to reach the patient to complete the Transitions of Care (Post Inpatient/ED visit) call.   Signature Tinnie Gens, RN BSN

## 2022-03-31 NOTE — Transitions of Care (Post Inpatient/ED Visit) (Signed)
   03/31/2022  Name: Julia Larson MRN: [id number] DOB: 09-01-1955  Today's TOC FU Call Status: Today's TOC FU Call Status:: Unsuccessful Call (2nd Attempt) Unsuccessful Call (1st Attempt) Date: 03/30/22 Unsuccessful Call (2nd Attempt) Date: 03/31/22  Attempted to reach the patient regarding the most recent Inpatient/ED visit.  Follow Up Plan: Additional outreach attempts will be made to reach the patient to complete the Transitions of Care (Post Inpatient/ED visit) call.   Signature Tinnie Gens, RN BSN

## 2022-04-03 NOTE — Transitions of Care (Post Inpatient/ED Visit) (Signed)
   04/03/2022  Name: Julia Larson MRN: [id number] DOB: 06/02/1955  Today's TOC FU Call Status: Today's TOC FU Call Status:: Unsuccessful Call (3rd Attempt) Unsuccessful Call (1st Attempt) Date: 03/30/22 Unsuccessful Call (2nd Attempt) Date: 03/31/22 Unsuccessful Call (3rd Attempt) Date: 04/03/22  Attempted to reach the patient regarding the most recent Inpatient/ED visit.  Follow Up Plan: No further outreach attempts will be made at this time. We have been unable to contact the patient.  Signature Tinnie Gens, RN BSN
